# Patient Record
Sex: Female | Born: 1990 | Race: Black or African American | Hispanic: No | Marital: Single | Smoking: Never smoker
Health system: Southern US, Community
[De-identification: ages and names within clinical notes are randomized; demographics above are authoritative.]

## PROBLEM LIST (undated history)

## (undated) DIAGNOSIS — Z789 Other specified health status: Secondary | ICD-10-CM

## (undated) HISTORY — PX: HERNIA REPAIR: SHX51

---

## 2017-02-09 ENCOUNTER — Ambulatory Visit (INDEPENDENT_AMBULATORY_CARE_PROVIDER_SITE_OTHER): Payer: BLUE CROSS/BLUE SHIELD | Admitting: Certified Nurse Midwife

## 2017-02-09 ENCOUNTER — Encounter: Payer: Self-pay | Admitting: Certified Nurse Midwife

## 2017-02-09 VITALS — BP 112/68 | HR 80 | Ht 66.5 in | Wt 181.9 lb

## 2017-02-09 DIAGNOSIS — O26899 Other specified pregnancy related conditions, unspecified trimester: Secondary | ICD-10-CM | POA: Diagnosis not present

## 2017-02-09 DIAGNOSIS — Z3201 Encounter for pregnancy test, result positive: Secondary | ICD-10-CM

## 2017-02-09 DIAGNOSIS — N926 Irregular menstruation, unspecified: Secondary | ICD-10-CM

## 2017-02-09 DIAGNOSIS — R109 Unspecified abdominal pain: Secondary | ICD-10-CM | POA: Diagnosis not present

## 2017-02-09 DIAGNOSIS — N912 Amenorrhea, unspecified: Secondary | ICD-10-CM

## 2017-02-09 LAB — POCT URINE PREGNANCY: PREG TEST UR: POSITIVE — AB

## 2017-02-09 NOTE — Patient Instructions (Signed)

## 2017-02-09 NOTE — Progress Notes (Signed)
GYN ENCOUNTER NOTE  Subjective:       Julia SessionsKenyata Sult is a 26 y.o. G1P0 female is here for gynecologic evaluation of the following issues: missed menses and positive pregnancy test.   [facility] [place]. This pregnancy was planned.   Toyna complains of breast tenderness, intermittent abdominal cramping, and diarrhea.   Denies difficulty breathing or respiratory distress, chest pain, nausea and vomiting, vaginal bleeding, and leg pain or swelling.   Gynecologic History  Patient's last menstrual period was 12/27/2016.  EDD: 10/03/2017  Gestational age: 53 weeks 2 days  Obstetric History OB History  Gravida Para Term Preterm AB Living  1            SAB TAB Ectopic Multiple Live Births               # Outcome Date GA Lbr Len/2nd Weight Sex Delivery Anes PTL Lv  1 Current               History reviewed. No pertinent past medical history.  Past Surgical History:  Procedure Laterality Date  . HERNIA REPAIR      No current outpatient prescriptions on file prior to visit.   No current facility-administered medications on file prior to visit.     No Known Allergies  Social History   Social History  . Marital status: Single    Spouse name: N/A  . Number of children: N/A  . Years of education: N/A   Occupational History  . Not on file.   Social History Main Topics  . Smoking status: Never Smoker  . Smokeless tobacco: Never Used  . Alcohol use No  . Drug use: No  . Sexual activity: Yes    Birth control/ protection: None   Other Topics Concern  . Not on file   Social History Narrative  . No narrative on file    History reviewed. No pertinent family history.  The following portions of the patient's history were reviewed and updated as appropriate: allergies, current medications, past family history, past medical history, past social history, past surgical history and problem list.  Review of  Systems  Review of Systems - Negative except for as noted above History obtained from the patient   Objective:   BP 112/68 (BP Location: Left Arm, Patient Position: Sitting, Cuff Size: Normal)   Pulse 80   Ht 5' 6.5" (1.689 m)   Wt 181 lb 14.4 oz (82.5 kg)   LMP 12/27/2016   BMI 28.92 kg/m   Alert and oriented x 4  Positive UPT  Assessment:   1. Amenorrhea  - POCT urine pregnancy  2. Missed menses   3. Positive pregnancy test   4. Cramping affecting pregnancy, antepartum  - US OB Transvaginal; Future  Plan:   Encouraged PNV with folic acid and DHA Reviewed pregnancy safe foods and medications RTC x 2 weeks for dating and viability US RTC x 4 weeks for nurse intake Reviewed red flag symptoms and when to call   Gunnar BullaJenkins Michelle Winter Trefz, CNM

## 2017-02-20 ENCOUNTER — Ambulatory Visit (INDEPENDENT_AMBULATORY_CARE_PROVIDER_SITE_OTHER): Payer: BLUE CROSS/BLUE SHIELD

## 2017-02-20 DIAGNOSIS — O26899 Other specified pregnancy related conditions, unspecified trimester: Secondary | ICD-10-CM | POA: Diagnosis not present

## 2017-02-20 DIAGNOSIS — R109 Unspecified abdominal pain: Secondary | ICD-10-CM

## 2017-03-06 ENCOUNTER — Encounter: Payer: Self-pay | Admitting: Certified Nurse Midwife

## 2017-03-06 ENCOUNTER — Ambulatory Visit (INDEPENDENT_AMBULATORY_CARE_PROVIDER_SITE_OTHER): Payer: BLUE CROSS/BLUE SHIELD | Admitting: Certified Nurse Midwife

## 2017-03-06 VITALS — BP 105/61 | HR 70 | Wt 182.6 lb

## 2017-03-06 DIAGNOSIS — O26891 Other specified pregnancy related conditions, first trimester: Secondary | ICD-10-CM

## 2017-03-06 DIAGNOSIS — N898 Other specified noninflammatory disorders of vagina: Secondary | ICD-10-CM

## 2017-03-06 DIAGNOSIS — Z3491 Encounter for supervision of normal pregnancy, unspecified, first trimester: Secondary | ICD-10-CM

## 2017-03-06 LAB — POCT URINALYSIS DIPSTICK
Bilirubin, UA: NEGATIVE
Blood, UA: NEGATIVE
Glucose, UA: NEGATIVE
Ketones, UA: NEGATIVE
Leukocytes, UA: NEGATIVE
Nitrite, UA: NEGATIVE
Protein, UA: NEGATIVE
Spec Grav, UA: 1.015
Urobilinogen, UA: NEGATIVE
pH, UA: 5

## 2017-03-06 MED ORDER — PROVIDA DHA 16-16-1.25-110 MG PO CAPS: 1.0000 | ORAL_CAPSULE | Freq: Every day | ORAL | 6 refills | Status: DC

## 2017-03-06 NOTE — Patient Instructions (Signed)
First Trimester of Pregnancy The first trimester of pregnancy is from week 1 until the end of week 13 (months 1 through 3). During this time, your baby will begin to develop inside you. At 6-8 weeks, the eyes and face are formed, and the heartbeat can be seen on ultrasound. At the end of 12 weeks, all the baby's organs are formed. Prenatal care is all the medical care you receive before the birth of your baby. Make sure you get good prenatal care and follow all of your doctor's instructions. Follow these instructions at home: Medicines  Take over-the-counter and prescription medicines only as told by your doctor. Some medicines are safe and some medicines are not safe during pregnancy.  Take a prenatal vitamin that contains at least 600 micrograms (mcg) of folic acid.  If you have trouble pooping (constipation), take medicine that will make your stool soft (stool softener) if your doctor approves. Eating and drinking  Eat regular, healthy meals.  Your doctor will tell you the amount of weight gain that is right for you.  Avoid raw meat and uncooked cheese.  If you feel sick to your stomach (nauseous) or throw up (vomit): ? Eat 4 or 5 small meals a day instead of 3 large meals. ? Try eating a few soda crackers. ? Drink liquids between meals instead of during meals.  To prevent constipation: ? Eat foods that are high in fiber, like fresh fruits and vegetables, whole grains, and beans. ? Drink enough fluids to keep your pee (urine) clear or pale yellow. Activity  Exercise only as told by your doctor. Stop exercising if you have cramps or pain in your lower belly (abdomen) or low back.  Do not exercise if it is too hot, too humid, or if you are in a place of great height (high altitude).  Try to avoid standing for long periods of time. Move your legs often if you must stand in one place for a long time.  Avoid heavy lifting.  Wear low-heeled shoes. Sit and stand up straight.  You  can have sex unless your doctor tells you not to. Relieving pain and discomfort  Wear a good support bra if your breasts are sore.  Take warm water baths (sitz baths) to soothe pain or discomfort caused by hemorrhoids. Use hemorrhoid cream if your doctor says it is okay.  Rest with your legs raised if you have leg cramps or low back pain.  If you have puffy, bulging veins (varicose veins) in your legs: ? Wear support hose or compression stockings as told by your doctor. ? Raise (elevate) your feet for 15 minutes, 3-4 times a day. ? Limit salt in your food. Prenatal care  Schedule your prenatal visits by the twelfth week of pregnancy.  Write down your questions. Take them to your prenatal visits.  Keep all your prenatal visits as told by your doctor. This is important. Safety  Wear your seat belt at all times when driving.  Make a list of emergency phone numbers. The list should include numbers for family, friends, the hospital, and police and fire departments. General instructions  Ask your doctor for a referral to a local prenatal class. Begin classes no later than at the start of month 6 of your pregnancy.  Ask for help if you need counseling or if you need help with nutrition. Your doctor can give you advice or tell you where to go for help.  Do not use hot tubs, steam rooms, or   saunas.  Do not douche or use tampons or scented sanitary pads.  Do not Torry your legs for long periods of time.  Avoid all herbs and alcohol. Avoid drugs that are not approved by your doctor.  Do not use any tobacco products, including cigarettes, chewing tobacco, and electronic cigarettes. If you need help quitting, ask your doctor. You may get counseling or other support to help you quit.  Avoid cat litter boxes and soil used by cats. These carry germs that can cause birth defects in the baby and can cause a loss of your baby (miscarriage) or stillbirth.  Visit your dentist. At home, brush  your teeth with a soft toothbrush. Be gentle when you floss. Contact a doctor if:  You are dizzy.  You have mild cramps or pressure in your lower belly.  You have a nagging pain in your belly area.  You continue to feel sick to your stomach, you throw up, or you have watery poop (diarrhea).  You have a bad smelling fluid coming from your vagina.  You have pain when you pee (urinate).  You have increased puffiness (swelling) in your face, hands, legs, or ankles. Get help right away if:  You have a fever.  You are leaking fluid from your vagina.  You have spotting or bleeding from your vagina.  You have very bad belly cramping or pain.  You gain or lose weight rapidly.  You throw up blood. It may look like coffee grounds.  You are around people who have German measles, fifth disease, or chickenpox.  You have a very bad headache.  You have shortness of breath.  You have any kind of trauma, such as from a fall or a car accident. Summary  The first trimester of pregnancy is from week 1 until the end of week 13 (months 1 through 3).  To take care of yourself and your unborn baby, you will need to eat healthy meals, take medicines only if your doctor tells you to do so, and do activities that are safe for you and your baby.  Keep all follow-up visits as told by your doctor. This is important as your doctor will have to ensure that your baby is healthy and growing well. This information is not intended to replace advice given to you by your health care provider. Make sure you discuss any questions you have with your health care provider. Document Released: 05/19/2008 Document Revised: 12/09/2016 Document Reviewed: 12/09/2016 Elsevier Interactive Patient Education  2017 Elsevier Inc.  

## 2017-03-06 NOTE — Progress Notes (Signed)
.  Grayce SessionsKenyata Anglemyer presents for NOB nurse interview visit. Pregnancy confirmation done 02/09/2017 by JML.  G- 1.  P- .0 Pregnancy education material explained and given.03/06/17 __0_ cats in the home. NOB labs ordered. (TSH/HbgA1c due to Increased BMI), (sickle cell). HIV labs and Drug screen were explained optional and she did not decline. Drug screen ordered. PNV encouraged. Genetic screening options discussed. Genetic testing: unsure.  Pt may discuss with provider at next visit. Pt. To follow up with provider in _4_ weeks for NOB physical.  All questions answered. C/o slight vag odor- has hx of Trich; NuSwab self collected.

## 2017-03-06 NOTE — Progress Notes (Signed)
I have reviewed the record and concur with patient management and plan.    Zeek Rostron Michelle Opha Mcghee, CNM [facility] 

## 2017-03-08 LAB — SICKLE CELL SCREEN: SICKLE CELL SCREEN: NEGATIVE

## 2017-03-08 LAB — CBC WITH DIFFERENTIAL
Basophils Absolute: 0 10*3/uL (ref 0.0–0.2)
Basos: 0 %
EOS (ABSOLUTE): 0 10*3/uL (ref 0.0–0.4)
EOS: 1 %
HEMATOCRIT: 38.9 % (ref 34.0–46.6)
Hemoglobin: 12.8 g/dL (ref 11.1–15.9)
Immature Grans (Abs): 0 10*3/uL (ref 0.0–0.1)
Immature Granulocytes: 0 %
Lymphocytes Absolute: 1.3 10*3/uL (ref 0.7–3.1)
Lymphs: 27 %
MCH: 28.3 pg (ref 26.6–33.0)
MCHC: 32.9 g/dL (ref 31.5–35.7)
MCV: 86 fL (ref 79–97)
MONOS ABS: 0.3 10*3/uL (ref 0.1–0.9)
Monocytes: 6 %
Neutrophils Absolute: 3.2 10*3/uL (ref 1.4–7.0)
Neutrophils: 66 %
RBC: 4.52 x10E6/uL (ref 3.77–5.28)
RDW: 14.1 % (ref 12.3–15.4)
WBC: 4.9 10*3/uL (ref 3.4–10.8)

## 2017-03-08 LAB — HEPATITIS B SURFACE ANTIGEN: HEP B S AG: NEGATIVE

## 2017-03-08 LAB — GC/CHLAMYDIA PROBE AMP
Chlamydia trachomatis, NAA: NEGATIVE
Neisseria gonorrhoeae by PCR: NEGATIVE

## 2017-03-08 LAB — RPR: RPR: NONREACTIVE

## 2017-03-08 LAB — ANTIBODY SCREEN: Antibody Screen: NEGATIVE

## 2017-03-08 LAB — HIV ANTIBODY (ROUTINE TESTING W REFLEX): HIV SCREEN 4TH GENERATION: NONREACTIVE

## 2017-03-08 LAB — URINE CULTURE

## 2017-03-08 LAB — VARICELLA ZOSTER ANTIBODY, IGG: Varicella zoster IgG: 1258 index (ref >165)

## 2017-03-08 LAB — ABO AND RH: Rh Factor: POSITIVE

## 2017-03-08 LAB — RUBELLA SCREEN: Rubella Antibodies, IGG: 4.2 index (ref >0.99)

## 2017-03-09 ENCOUNTER — Encounter: Payer: Self-pay | Admitting: Certified Nurse Midwife

## 2017-03-09 NOTE — Addendum Note (Signed)
Addended by: Marchelle FolksMILLER, Emilyann Banka G on: 03/09/2017 03:59 PM   Modules accepted: Orders

## 2017-03-12 ENCOUNTER — Other Ambulatory Visit: Payer: Self-pay | Admitting: Certified Nurse Midwife

## 2017-03-12 ENCOUNTER — Other Ambulatory Visit: Payer: Self-pay

## 2017-03-12 LAB — NUSWAB BV AND CANDIDA, NAA
Atopobium vaginae: HIGH Score — AB
BVAB 2: HIGH {score} — AB
Candida albicans, NAA: NEGATIVE
Candida glabrata, NAA: NEGATIVE
Megasphaera 1: HIGH Score — AB

## 2017-03-12 MED ORDER — METRONIDAZOLE 500 MG PO TABS: 500.0000 mg | ORAL_TABLET | Freq: Two times a day (BID) | ORAL | 0 refills | Status: AC

## 2017-03-12 NOTE — Telephone Encounter (Signed)
Pt has 6 refills, paper faxed.

## 2017-03-13 LAB — URINALYSIS, ROUTINE W REFLEX MICROSCOPIC
Bilirubin, UA: NEGATIVE
GLUCOSE, UA: NEGATIVE
Ketones, UA: NEGATIVE
Leukocytes, UA: NEGATIVE
Nitrite, UA: NEGATIVE
Protein, UA: NEGATIVE
RBC, UA: NEGATIVE
SPEC GRAV UA: 1.02 (ref 1.005–1.030)
Urobilinogen, Ur: 0.2 mg/dL (ref 0.2–1.0)
pH, UA: 8 — ABNORMAL HIGH (ref 5.0–7.5)

## 2017-03-13 LAB — CANNABINOID (GC/MS), URINE
CANNABINOID UR: POSITIVE — AB
CARBOXY THC UR: 61 ng/mL

## 2017-03-13 LAB — MONITOR DRUG PROFILE 14(MW)
AMPHETAMINE SCREEN URINE: NEGATIVE ng/mL
BARBITURATE SCREEN URINE: NEGATIVE ng/mL
BENZODIAZEPINE SCREEN, URINE: NEGATIVE ng/mL
Buprenorphine, Urine: NEGATIVE ng/mL
Cocaine (Metab) Scrn, Ur: NEGATIVE ng/mL
Creatinine(Crt), U: 111 mg/dL (ref 20.0–300.0)
FENTANYL, URINE: NEGATIVE pg/mL
Meperidine Screen, Urine: NEGATIVE ng/mL
Methadone Screen, Urine: NEGATIVE ng/mL
OXYCODONE+OXYMORPHONE UR QL SCN: NEGATIVE ng/mL
Opiate Scrn, Ur: NEGATIVE ng/mL
PH UR, DRUG SCRN: 8.8 (ref 4.5–8.9)
Phencyclidine Qn, Ur: NEGATIVE ng/mL
Propoxyphene Scrn, Ur: NEGATIVE ng/mL
SPECIFIC GRAVITY: 1.022
Tramadol Screen, Urine: NEGATIVE ng/mL

## 2017-03-13 LAB — NICOTINE SCREEN, URINE: COTININE UR QL SCN: NEGATIVE ng/mL

## 2017-03-31 ENCOUNTER — Ambulatory Visit (INDEPENDENT_AMBULATORY_CARE_PROVIDER_SITE_OTHER): Payer: BLUE CROSS/BLUE SHIELD | Admitting: Certified Nurse Midwife

## 2017-03-31 VITALS — BP 97/52 | HR 85 | Wt 184.9 lb

## 2017-03-31 DIAGNOSIS — F1291 Cannabis use, unspecified, in remission: Secondary | ICD-10-CM

## 2017-03-31 DIAGNOSIS — Z87898 Personal history of other specified conditions: Secondary | ICD-10-CM

## 2017-03-31 DIAGNOSIS — Z3492 Encounter for supervision of normal pregnancy, unspecified, second trimester: Secondary | ICD-10-CM

## 2017-03-31 LAB — POCT URINALYSIS DIPSTICK
Bilirubin, UA: NEGATIVE
Glucose, UA: NEGATIVE
KETONES UA: NEGATIVE
Leukocytes, UA: NEGATIVE
Nitrite, UA: NEGATIVE
RBC UA: NEGATIVE
SPEC GRAV UA: 1.01 (ref 1.010–1.025)
UROBILINOGEN UA: 0.2 U/dL
pH, UA: 7 (ref 5.0–8.0)

## 2017-03-31 NOTE — Progress Notes (Signed)
NEW OB HISTORY AND PHYSICAL  SUBJECTIVE:       Julia Rollins is a 26 y.o. G1P0 female, Patient's last menstrual period was 12/27/2016., Estimated Date of Delivery: 10/03/17, [redacted]w[redacted]d, presents today for establishment of Prenatal Care.  She reports nausea and breast tenderness.   Denies difficulty breathing or respiratory distress, chest pain, abdominal pain, vaginal bleeding, and leg pain or swelling.   Declines genetic screening.    Gynecologic History  Patient's last menstrual period was 12/27/2016.   Last Pap: 01/2017. Results were: normal  Obstetric History OB History  Gravida Para Term Preterm AB Living  1            SAB TAB Ectopic Multiple Live Births               # Outcome Date GA Lbr Len/2nd Weight Sex Delivery Anes PTL Lv  1 Current               No past medical history on file.  Past Surgical History:  Procedure Laterality Date  . HERNIA REPAIR      Current Outpatient Prescriptions on File Prior to Visit  Medication Sig Dispense Refill  . Prenatal Vit-Fe Fumarate-FA (PRENATAL MULTIVITAMIN) TABS tablet Take 1 tablet by mouth daily at 12 noon.    . Prenat-FeFum-FePo-FA-DHA w/o A (PROVIDA DHA) 16-16-1.25-110 MG CAPS Take 1 tablet by mouth daily. (Patient not taking: Reported on 03/31/2017) 30 capsule 6   No current facility-administered medications on file prior to visit.     No Known Allergies  Social History   Social History  . Marital status: Single    Spouse name: N/A  . Number of children: N/A  . Years of education: N/A   Occupational History  . Not on file.   Social History Main Topics  . Smoking status: Never Smoker  . Smokeless tobacco: Never Used  . Alcohol use No  . Drug use: No  . Sexual activity: Yes    Birth control/ protection: None   Other Topics Concern  . Not on file   Social History Narrative  . No narrative on file    No family history on file.  The following portions of the patient's history were reviewed and updated  as appropriate: allergies, current medications, past OB history, past medical history, past surgical history, past family history, past social history, and problem list.    OBJECTIVE: Initial Physical Exam (New OB)  GENERAL APPEARANCE: alert, well appearing, in no apparent distress  HEAD: normocephalic, atraumatic  MOUTH: mucous membranes moist, pharynx normal without lesions and dental hygiene good  THYROID: no thyromegaly or masses present  BREASTS: not examined  LUNGS: clear to auscultation, no wheezes, rales or rhonchi, symmetric air entry  HEART: regular rate and rhythm, no murmurs  ABDOMEN: soft, nontender, nondistended, no abnormal masses, no epigastric pain, fundus not palpable and FHT present  EXTREMITIES: no redness or tenderness in the calves or thighs, no edema  SKIN: normal coloration and turgor, no rashes  LYMPH NODES: no adenopathy palpable  NEUROLOGIC: alert, oriented, normal speech, no focal findings or movement disorder noted  PELVIC EXAM not examined  ASSESSMENT: Normal pregnancy  PLAN: Prenatal care Reviewed results of New OB labs work, pt no longer smoking marijuana Discussed safe pregnancy foods and medications.  Reviewed practice policies, red flag symptoms, and when to call.  RTC x 4 weeks for ROB.  See orders   Gunnar Bulla, CNM

## 2017-03-31 NOTE — Progress Notes (Signed)
NOB physical- pt is doing well, denies any complaints 

## 2017-03-31 NOTE — Patient Instructions (Signed)
Second Trimester of Pregnancy The second trimester is from week 13 through week 28, month 4 through 6. This is often the time in pregnancy that you feel your best. Often times, morning sickness has lessened or quit. You may have more energy, and you may get hungry more often. Your unborn baby (fetus) is growing rapidly. At the end of the sixth month, he or she is about 9 inches long and weighs about 1 pounds. You will likely feel the baby move (quickening) between 18 and 20 weeks of pregnancy. Follow these instructions at home:  Avoid all smoking, herbs, and alcohol. Avoid drugs not approved by your doctor.  Do not use any tobacco products, including cigarettes, chewing tobacco, and electronic cigarettes. If you need help quitting, ask your doctor. You may get counseling or other support to help you quit.  Only take medicine as told by your doctor. Some medicines are safe and some are not during pregnancy.  Exercise only as told by your doctor. Stop exercising if you start having cramps.  Eat regular, healthy meals.  Wear a good support bra if your breasts are tender.  Do not use hot tubs, steam rooms, or saunas.  Wear your seat belt when driving.  Avoid raw meat, uncooked cheese, and liter boxes and soil used by cats.  Take your prenatal vitamins.  Take 1500-2000 milligrams of calcium daily starting at the 20th week of pregnancy until you deliver your baby.  Try taking medicine that helps you poop (stool softener) as needed, and if your doctor approves. Eat more fiber by eating fresh fruit, vegetables, and whole grains. Drink enough fluids to keep your pee (urine) clear or pale yellow.  Take warm water baths (sitz baths) to soothe pain or discomfort caused by hemorrhoids. Use hemorrhoid cream if your doctor approves.  If you have puffy, bulging veins (varicose veins), wear support hose. Raise (elevate) your feet for 15 minutes, 3-4 times a day. Limit salt in your diet.  Avoid heavy  lifting, wear low heals, and sit up straight.  Rest with your legs raised if you have leg cramps or low back pain.  Visit your dentist if you have not gone during your pregnancy. Use a soft toothbrush to brush your teeth. Be gentle when you floss.  You can have sex (intercourse) unless your doctor tells you not to.  Go to your doctor visits. Get help if:  You feel dizzy.  You have mild cramps or pressure in your lower belly (abdomen).  You have a nagging pain in your belly area.  You continue to feel sick to your stomach (nauseous), throw up (vomit), or have watery poop (diarrhea).  You have bad smelling fluid coming from your vagina.  You have pain with peeing (urination). Get help right away if:  You have a fever.  You are leaking fluid from your vagina.  You have spotting or bleeding from your vagina.  You have severe belly cramping or pain.  You lose or gain weight rapidly.  You have trouble catching your breath and have chest pain.  You notice sudden or extreme puffiness (swelling) of your face, hands, ankles, feet, or legs.  You have not felt the baby move in over an hour.  You have severe headaches that do not go away with medicine.  You have vision changes. This information is not intended to replace advice given to you by your health care provider. Make sure you discuss any questions you have with your health care   provider. Document Released: 02/25/2010 Document Revised: 05/08/2016 Document Reviewed: 02/01/2013 Elsevier Interactive Patient Education  2017 Elsevier Inc.  

## 2017-04-06 ENCOUNTER — Encounter: Payer: Self-pay | Admitting: Certified Nurse Midwife

## 2017-04-08 DIAGNOSIS — F1290 Cannabis use, unspecified, uncomplicated: Secondary | ICD-10-CM | POA: Insufficient documentation

## 2017-04-08 DIAGNOSIS — Z87898 Personal history of other specified conditions: Secondary | ICD-10-CM | POA: Insufficient documentation

## 2017-04-08 DIAGNOSIS — F1291 Cannabis use, unspecified, in remission: Secondary | ICD-10-CM | POA: Insufficient documentation

## 2017-04-12 ENCOUNTER — Encounter: Payer: Self-pay | Admitting: Certified Nurse Midwife

## 2017-04-29 ENCOUNTER — Ambulatory Visit (INDEPENDENT_AMBULATORY_CARE_PROVIDER_SITE_OTHER): Payer: BLUE CROSS/BLUE SHIELD | Admitting: Certified Nurse Midwife

## 2017-04-29 VITALS — BP 96/63 | HR 77 | Wt 188.3 lb

## 2017-04-29 DIAGNOSIS — Z3492 Encounter for supervision of normal pregnancy, unspecified, second trimester: Secondary | ICD-10-CM

## 2017-04-29 LAB — POCT URINALYSIS DIPSTICK
BILIRUBIN UA: NEGATIVE
GLUCOSE UA: NEGATIVE
Ketones, UA: NEGATIVE
Leukocytes, UA: NEGATIVE
NITRITE UA: NEGATIVE
Protein, UA: NEGATIVE
RBC UA: NEGATIVE
SPEC GRAV UA: 1.01 (ref 1.010–1.025)
UROBILINOGEN UA: 0.2 U/dL
pH, UA: 7.5 (ref 5.0–8.0)

## 2017-04-29 NOTE — Progress Notes (Signed)
ROB doing well. No complaints. States that she is not able to take the prenatals , it made her sick. Samples given to her. She states that she is feeling some fluttering. Denies LOF, Vag bleeding and contractions. She will return in 3 wks for anatomy scan and ROB.  Doreene BurkeAnnie Burnie Hank, CNM

## 2017-04-29 NOTE — Patient Instructions (Signed)

## 2017-05-13 ENCOUNTER — Other Ambulatory Visit: Payer: Self-pay | Admitting: Certified Nurse Midwife

## 2017-05-13 DIAGNOSIS — Z369 Encounter for antenatal screening, unspecified: Secondary | ICD-10-CM

## 2017-05-20 ENCOUNTER — Ambulatory Visit (INDEPENDENT_AMBULATORY_CARE_PROVIDER_SITE_OTHER): Payer: BLUE CROSS/BLUE SHIELD

## 2017-05-20 ENCOUNTER — Encounter: Payer: Self-pay | Admitting: Certified Nurse Midwife

## 2017-05-20 ENCOUNTER — Ambulatory Visit (INDEPENDENT_AMBULATORY_CARE_PROVIDER_SITE_OTHER): Payer: BLUE CROSS/BLUE SHIELD | Admitting: Certified Nurse Midwife

## 2017-05-20 VITALS — BP 106/62 | HR 66 | Wt 189.9 lb

## 2017-05-20 DIAGNOSIS — Z369 Encounter for antenatal screening, unspecified: Secondary | ICD-10-CM

## 2017-05-20 DIAGNOSIS — Z3482 Encounter for supervision of other normal pregnancy, second trimester: Secondary | ICD-10-CM | POA: Diagnosis not present

## 2017-05-20 DIAGNOSIS — Z3492 Encounter for supervision of normal pregnancy, unspecified, second trimester: Secondary | ICD-10-CM

## 2017-05-20 LAB — POCT URINALYSIS DIPSTICK
BILIRUBIN UA: NEGATIVE
Glucose, UA: NEGATIVE
KETONES UA: NEGATIVE
Nitrite, UA: NEGATIVE
PH UA: 6.5 (ref 5.0–8.0)
Protein, UA: NEGATIVE
RBC UA: NEGATIVE
SPEC GRAV UA: 1.01 (ref 1.010–1.025)
Urobilinogen, UA: 0.2 E.U./dL

## 2017-06-02 ENCOUNTER — Encounter: Payer: Self-pay | Admitting: Certified Nurse Midwife

## 2017-06-02 ENCOUNTER — Ambulatory Visit (INDEPENDENT_AMBULATORY_CARE_PROVIDER_SITE_OTHER): Payer: BLUE CROSS/BLUE SHIELD | Admitting: Certified Nurse Midwife

## 2017-06-02 VITALS — BP 107/63 | HR 96 | Wt 193.2 lb

## 2017-06-02 DIAGNOSIS — Z131 Encounter for screening for diabetes mellitus: Secondary | ICD-10-CM

## 2017-06-02 DIAGNOSIS — R82998 Other abnormal findings in urine: Secondary | ICD-10-CM

## 2017-06-02 DIAGNOSIS — Z130 Encounter for screening for diseases of the blood and blood-forming organs and certain disorders involving the immune mechanism: Secondary | ICD-10-CM

## 2017-06-02 DIAGNOSIS — Z3492 Encounter for supervision of normal pregnancy, unspecified, second trimester: Secondary | ICD-10-CM

## 2017-06-02 DIAGNOSIS — R8299 Other abnormal findings in urine: Secondary | ICD-10-CM

## 2017-06-02 LAB — POCT URINALYSIS DIPSTICK
BILIRUBIN UA: NEGATIVE
Blood, UA: NEGATIVE
Glucose, UA: NEGATIVE
Nitrite, UA: NEGATIVE
PH UA: 6 (ref 5.0–8.0)
PROTEIN UA: NEGATIVE
Spec Grav, UA: 1.02 (ref 1.010–1.025)
UROBILINOGEN UA: 0.2 U/dL

## 2017-06-02 NOTE — Patient Instructions (Signed)

## 2017-06-02 NOTE — Progress Notes (Signed)
ROB, doing well. No complaints. PT to schedule u/s to complete anatomy scan as soon as possible. Had urinary urgency but drank a lot of water and has resolved. Urine dip today show mod leukocytes and small ketones. Sent for culture.  Follow up ROB in 4 wks.   Doreene BurkeAnnie Clayson Riling, CNM

## 2017-06-05 ENCOUNTER — Other Ambulatory Visit: Payer: Self-pay | Admitting: Certified Nurse Midwife

## 2017-06-05 DIAGNOSIS — Z0489 Encounter for examination and observation for other specified reasons: Secondary | ICD-10-CM

## 2017-06-05 DIAGNOSIS — IMO0002 Reserved for concepts with insufficient information to code with codable children: Secondary | ICD-10-CM

## 2017-06-07 LAB — URINE CULTURE, OB REFLEX

## 2017-06-07 LAB — CULTURE, OB URINE

## 2017-06-08 ENCOUNTER — Encounter: Payer: Self-pay | Admitting: Certified Nurse Midwife

## 2017-06-15 ENCOUNTER — Ambulatory Visit (INDEPENDENT_AMBULATORY_CARE_PROVIDER_SITE_OTHER): Payer: BLUE CROSS/BLUE SHIELD

## 2017-06-15 DIAGNOSIS — Z048 Encounter for examination and observation for other specified reasons: Secondary | ICD-10-CM

## 2017-06-15 DIAGNOSIS — Z0489 Encounter for examination and observation for other specified reasons: Secondary | ICD-10-CM

## 2017-06-15 DIAGNOSIS — IMO0002 Reserved for concepts with insufficient information to code with codable children: Secondary | ICD-10-CM

## 2017-06-16 ENCOUNTER — Other Ambulatory Visit: Payer: BLUE CROSS/BLUE SHIELD

## 2017-06-30 ENCOUNTER — Encounter: Payer: BLUE CROSS/BLUE SHIELD | Admitting: Certified Nurse Midwife

## 2017-07-22 ENCOUNTER — Ambulatory Visit (INDEPENDENT_AMBULATORY_CARE_PROVIDER_SITE_OTHER): Payer: BLUE CROSS/BLUE SHIELD | Admitting: Certified Nurse Midwife

## 2017-07-22 VITALS — BP 104/62 | HR 90 | Wt 201.9 lb

## 2017-07-22 DIAGNOSIS — Z3403 Encounter for supervision of normal first pregnancy, third trimester: Secondary | ICD-10-CM

## 2017-07-22 LAB — POCT URINALYSIS DIPSTICK
BILIRUBIN UA: NEGATIVE
Blood, UA: NEGATIVE
GLUCOSE UA: NEGATIVE
KETONES UA: NEGATIVE
LEUKOCYTES UA: NEGATIVE
Nitrite, UA: NEGATIVE
Protein, UA: NEGATIVE
SPEC GRAV UA: 1.015 (ref 1.010–1.025)
Urobilinogen, UA: 0.2 E.U./dL
pH, UA: 6 (ref 5.0–8.0)

## 2017-07-22 NOTE — Progress Notes (Signed)
ROB, doing well. No complaints or concerns. Endorses good fetal movement. Follow up in 2 wks.   Doreene BurkeAnnie Piper Albro, CNM

## 2017-07-22 NOTE — Patient Instructions (Signed)

## 2017-08-04 ENCOUNTER — Encounter: Payer: BLUE CROSS/BLUE SHIELD | Admitting: Certified Nurse Midwife

## 2017-08-06 ENCOUNTER — Encounter: Payer: BLUE CROSS/BLUE SHIELD | Admitting: Certified Nurse Midwife

## 2017-08-12 ENCOUNTER — Encounter: Payer: Self-pay | Admitting: Certified Nurse Midwife

## 2017-08-12 ENCOUNTER — Ambulatory Visit (INDEPENDENT_AMBULATORY_CARE_PROVIDER_SITE_OTHER): Payer: Medicaid Other | Admitting: Certified Nurse Midwife

## 2017-08-12 VITALS — BP 120/67 | HR 88 | Wt 208.4 lb

## 2017-08-12 DIAGNOSIS — L298 Other pruritus: Secondary | ICD-10-CM

## 2017-08-12 DIAGNOSIS — N898 Other specified noninflammatory disorders of vagina: Secondary | ICD-10-CM

## 2017-08-12 DIAGNOSIS — Z3403 Encounter for supervision of normal first pregnancy, third trimester: Secondary | ICD-10-CM

## 2017-08-12 LAB — POCT URINALYSIS DIPSTICK
BILIRUBIN UA: NEGATIVE
Blood, UA: NEGATIVE
GLUCOSE UA: NEGATIVE
KETONES UA: NEGATIVE
LEUKOCYTES UA: NEGATIVE
Nitrite, UA: NEGATIVE
PROTEIN UA: NEGATIVE
SPEC GRAV UA: 1.01 (ref 1.010–1.025)
Urobilinogen, UA: 0.2 E.U./dL
pH, UA: 6 (ref 5.0–8.0)

## 2017-08-12 NOTE — Progress Notes (Signed)
Pt presents today for ROB . She complains of increased discharge and itching x 1 wk. She denies any exposure to STD. NUswab today f. She denies contractions and endorses good fetal movement. Encouraged pt to use Monistat .  She states that she never had 1 hr GTT. Last appointment was canceled due to CNM rescheduling ( CNM in delivery). Will follow up with results. Pt encouraged to have 1 hr GTT today, she states that she can not. She will return in the next day or 2 for lab draw. Return as scheduled for next ROB appointment.   Doreene BurkeAnnie Arretta Toenjes, CNM

## 2017-08-12 NOTE — Patient Instructions (Addendum)
Vaginal Yeast infection, Adult Vaginal yeast infection is a condition that causes soreness, swelling, and redness (inflammation) of the vagina. It also causes vaginal discharge. This is a common condition. Some women get this infection frequently. What are the causes? This condition is caused by a change in the normal balance of the yeast (candida) and bacteria that live in the vagina. This change causes an overgrowth of yeast, which causes the inflammation. What increases the risk? This condition is more likely to develop in:  Women who take antibiotic medicines.  Women who have diabetes.  Women who take birth control pills.  Women who are pregnant.  Women who douche often.  Women who have a weak defense (immune) system.  Women who have been taking steroid medicines for a long time.  Women who frequently wear tight clothing.  What are the signs or symptoms? Symptoms of this condition include:  White, thick vaginal discharge.  Swelling, itching, redness, and irritation of the vagina. The lips of the vagina (vulva) may be affected as well.  Pain or a burning feeling while urinating.  Pain during sex.  How is this diagnosed? This condition is diagnosed with a medical history and physical exam. This will include a pelvic exam. Your health care provider will examine a sample of your vaginal discharge under a microscope. Your health care provider may send this sample for testing to confirm the diagnosis. How is this treated? This condition is treated with medicine. Medicines may be over-the-counter or prescription. You may be told to use one or more of the following:  Medicine that is taken orally.  Medicine that is applied as a cream.  Medicine that is inserted directly into the vagina (suppository).  Follow these instructions at home:  Take or apply over-the-counter and prescription medicines only as told by your health care provider.  Do not have sex until your health  care provider has approved. Tell your sex partner that you have a yeast infection. That person should go to his or her health care provider if he or she develops symptoms.  Do not wear tight clothes, such as pantyhose or tight pants.  Avoid using tampons until your health care provider approves.  Eat more yogurt. This may help to keep your yeast infection from returning.  Try taking a sitz bath to help with discomfort. This is a warm water bath that is taken while you are sitting down. The water should only come up to your hips and should cover your buttocks. Do this 3-4 times per day or as told by your health care provider.  Do not douche.  Wear breathable, cotton underwear.  If you have diabetes, keep your blood sugar levels under control. Contact a health care provider if:  You have a fever.  Your symptoms go away and then return.  Your symptoms do not get better with treatment.  Your symptoms get worse.  You have new symptoms.  You develop blisters in or around your vagina.  You have blood coming from your vagina and it is not your menstrual period.  You develop pain in your abdomen. This information is not intended to replace advice given to you by your health care provider. Make sure you discuss any questions you have with your health care provider. Document Released: 09/10/2005 Document Revised: 05/14/2016 Document Reviewed: 06/04/2015 Elsevier Interactive Patient Education  2018 [employer] Glucose Tolerance Test During Pregnancy The glucose tolerance test (GTT) is a blood test used to determine if you have developed  a type of diabetes during pregnancy (gestational diabetes). This is when your body does not properly process sugar (glucose) in the food you eat, resulting in high blood glucose levels. Typically, a GTT is done after you have had a 1-hour glucose test with results that indicate you possibly have gestational diabetes. It may also be done if:  You have a  history of giving birth to very large babies or have experienced repeated fetal loss (stillbirth).  You have signs and symptoms of diabetes, such as: ? Changes in your vision. ? Tingling or numbness in your hands or feet. ? Changes in hunger, thirst, and urination not otherwise explained by your pregnancy.  The GTT lasts about 3 hours. You will be given a sugar-water solution to drink at the beginning of the test. You will have blood drawn before you drink the solution and then again 1, 2, and 3 hours after you drink it. You will not be allowed to eat or drink anything else during the test. You must remain at the testing location to make sure that your blood is drawn on time. You should also avoid exercising during the test, because exercise can alter test results. How do I prepare for this test? Eat normally for 3 days prior to the GTT test, including having plenty of carbohydrate-rich foods. Do not eat or drink anything except water during the final 12 hours before the test. In addition, your health care provider may ask you to stop taking certain medicines before the test. What do the results mean? It is your responsibility to obtain your test results. Ask the lab or department performing the test when and how you will get your results. Contact your health care provider to discuss any questions you have about your results. Range of Normal Values Ranges for normal values may vary among different labs and hospitals. You should always check with your health care provider after having lab work or other tests done to discuss whether your values are considered within normal limits. Normal levels of blood glucose are as follows:  Fasting: less than 105 mg/dL.  1 hour after drinking the solution: less than 190 mg/dL.  2 hours after drinking the solution: less than 165 mg/dL.  3 hours after drinking the solution: less than 145 mg/dL.  Some substances can interfere with GTT results. These may  include:  Blood pressure and heart failure medicines, including beta blockers, furosemide, and thiazides.  Anti-inflammatory medicines, including aspirin.  Nicotine.  Some psychiatric medicines.  Meaning of Results Outside Normal Value Ranges GTT test results that are above normal values may indicate a number of health problems, such as:  Gestational diabetes.  Acute stress response.  Cushing syndrome.  Tumors such as pheochromocytoma or glucagonoma.  Long-term kidney problems.  Pancreatitis.  Hyperthyroidism.  Current infection.  Discuss your test results with your health care provider. He or she will use the results to make a diagnosis and determine a treatment plan that is right for you. This information is not intended to replace advice given to you by your health care provider. Make sure you discuss any questions you have with your health care provider. Document Released: 06/01/2012 Document Revised: 05/08/2016 Document Reviewed: 04/07/2014 Elsevier Interactive Patient Education  2017 [employer].

## 2017-08-15 LAB — NUSWAB VAGINITIS PLUS (VG+)
CANDIDA ALBICANS, NAA: POSITIVE — AB
CHLAMYDIA TRACHOMATIS, NAA: NEGATIVE
Candida glabrata, NAA: NEGATIVE
MEGASPHAERA 1: HIGH {score} — AB
NEISSERIA GONORRHOEAE, NAA: NEGATIVE
Trich vag by NAA: NEGATIVE

## 2017-08-16 ENCOUNTER — Encounter: Payer: Self-pay | Admitting: Certified Nurse Midwife

## 2017-08-16 ENCOUNTER — Other Ambulatory Visit: Payer: Self-pay | Admitting: Certified Nurse Midwife

## 2017-08-16 MED ORDER — METRONIDAZOLE 500 MG PO TABS: 500.0000 mg | ORAL_TABLET | Freq: Two times a day (BID) | ORAL | 0 refills | Status: AC

## 2017-08-16 NOTE — Progress Notes (Signed)
PT NuSwab positive for BV and yeast. She was instructed at visit to use monistat for the yeast. Order placed for Flagyl. She was notified via my chart.   Doreene BurkeAnnie Reeanna Acri, CNM

## 2017-08-27 ENCOUNTER — Ambulatory Visit (INDEPENDENT_AMBULATORY_CARE_PROVIDER_SITE_OTHER): Payer: Medicaid Other | Admitting: Certified Nurse Midwife

## 2017-08-27 ENCOUNTER — Other Ambulatory Visit: Payer: Medicaid Other

## 2017-08-27 VITALS — BP 108/66 | HR 80 | Wt 215.6 lb

## 2017-08-27 DIAGNOSIS — Z130 Encounter for screening for diseases of the blood and blood-forming organs and certain disorders involving the immune mechanism: Secondary | ICD-10-CM

## 2017-08-27 DIAGNOSIS — Z3403 Encounter for supervision of normal first pregnancy, third trimester: Secondary | ICD-10-CM

## 2017-08-27 DIAGNOSIS — Z131 Encounter for screening for diabetes mellitus: Secondary | ICD-10-CM

## 2017-08-27 NOTE — Patient Instructions (Addendum)
Vaginal Delivery Vaginal delivery means that you will give birth by pushing your baby out of your birth canal (vagina). A team of health care providers will help you before, during, and after vaginal delivery. Birth experiences are unique for every woman and every pregnancy, and birth experiences vary depending on where you choose to give birth. What should I do to prepare for my baby's birth? Before your baby is born, it is important to talk with your health care provider about:  Your labor and delivery preferences. These may include: ? Medicines that you may be given. ? How you will manage your pain. This might include non-medical pain relief techniques or injectable pain relief such as epidural analgesia. ? How you and your baby will be monitored during labor and delivery. ? Who may be in the labor and delivery room with you. ? Your feelings about surgical delivery of your baby (cesarean delivery, or C-section) if this becomes necessary. ? Your feelings about receiving donated blood through an IV tube (blood transfusion) if this becomes necessary.  Whether you are able: ? To take pictures or videos of the birth. ? To eat during labor and delivery. ? To move around, walk, or change positions during labor and delivery.  What to expect after your baby is born, such as: ? Whether delayed umbilical cord clamping and cutting is offered. ? Who will care for your baby right after birth. ? Medicines or tests that may be recommended for your baby. ? Whether breastfeeding is supported in your hospital or birth center. ? How long you will be in the hospital or birth center.  How any medical conditions you have may affect your baby or your labor and delivery experience.  To prepare for your baby's birth, you should also:  Attend all of your health care visits before delivery (prenatal visits) as recommended by your health care provider. This is important.  Prepare your home for your baby's  arrival. Make sure that you have: ? Diapers. ? Baby clothing. ? Feeding equipment. ? Safe sleeping arrangements for you and your baby.  Install a car seat in your vehicle. Have your car seat checked by a certified car seat installer to make sure that it is installed safely.  Think about who will help you with your new baby at home for at least the first several weeks after delivery.  What can I expect when I arrive at the birth center or hospital? Once you are in labor and have been admitted into the hospital or birth center, your health care provider may:  Review your pregnancy history and any concerns you have.  Insert an IV tube into one of your veins. This is used to give you fluids and medicines.  Check your blood pressure, pulse, temperature, and heart rate (vital signs).  Check whether your bag of water (amniotic sac) has broken (ruptured).  Talk with you about your birth plan and discuss pain control options.  Monitoring Your health care provider may monitor your contractions (uterine monitoring) and your baby's heart rate (fetal monitoring). You may need to be monitored:  Often, but not continuously (intermittently).  All the time or for long periods at a time (continuously). Continuous monitoring may be needed if: ? You are taking certain medicines, such as medicine to relieve pain or make your contractions stronger. ? You have pregnancy or labor complications.  Monitoring may be done by:  Placing a special stethoscope or a handheld monitoring device on your abdomen to   check your baby's heartbeat, and feeling your abdomen for contractions. This method of monitoring does not continuously record your baby's heartbeat or your contractions.  Placing monitors on your abdomen (external monitors) to record your baby's heartbeat and the frequency and length of contractions. You may not have to wear external monitors all the time.  Placing monitors inside of your uterus  (internal monitors) to record your baby's heartbeat and the frequency, length, and strength of your contractions. ? Your health care provider may use internal monitors if he or she needs more information about the strength of your contractions or your baby's heart rate. ? Internal monitors are put in place by passing a thin, flexible wire through your vagina and into your uterus. Depending on the type of monitor, it may remain in your uterus or on your baby's head until birth. ? Your health care provider will discuss the benefits and risks of internal monitoring with you and will ask for your permission before inserting the monitors.  Telemetry. This is a type of continuous monitoring that can be done with external or internal monitors. Instead of having to stay in bed, you are able to move around during telemetry. Ask your health care provider if telemetry is an option for you.  Physical exam Your health care provider may perform a physical exam. This may include:  Checking whether your baby is positioned: ? With the head toward your vagina (head-down). This is most common. ? With the head toward the top of your uterus (head-up or breech). If your baby is in a breech position, your health care provider may try to turn your baby to a head-down position so you can deliver vaginally. If it does not seem that your baby can be born vaginally, your provider may recommend surgery to deliver your baby. In rare cases, you may be able to deliver vaginally if your baby is head-up (breech delivery). ? Lying sideways (transverse). Babies that are lying sideways cannot be delivered vaginally.  Checking your cervix to determine: ? Whether it is thinning out (effacing). ? Whether it is opening up (dilating). ? How low your baby has moved into your birth canal.  What are the three stages of labor and delivery?  Normal labor and delivery is divided into the following three stages: Stage 1  Stage 1 is the  longest stage of labor, and it can last for hours or days. Stage 1 includes: ? Early labor. This is when contractions may be irregular, or regular and mild. Generally, early labor contractions are more than 10 minutes apart. ? Active labor. This is when contractions get longer, more regular, more frequent, and more intense. ? The transition phase. This is when contractions happen very close together, are very intense, and may last longer than during any other part of labor.  Contractions generally feel mild, infrequent, and irregular at first. They get stronger, more frequent (about every 2-3 minutes), and more regular as you progress from early labor through active labor and transition.  Many women progress through stage 1 naturally, but you may need help to continue making progress. If this happens, your health care provider may talk with you about: ? Rupturing your amniotic sac if it has not ruptured yet. ? Giving you medicine to help make your contractions stronger and more frequent.  Stage 1 ends when your cervix is completely dilated to 4 inches (10 cm) and completely effaced. This happens at the end of the transition phase. Stage 2  Once   your cervix is completely effaced and dilated to 4 inches (10 cm), you may start to feel an urge to push. It is common for the body to naturally take a rest before feeling the urge to push, especially if you received an epidural or certain other pain medicines. This rest period may last for up to 1-2 hours, depending on your unique labor experience.  During stage 2, contractions are generally less painful, because pushing helps relieve contraction pain. Instead of contraction pain, you may feel stretching and burning pain, especially when the widest part of your baby's head passes through the vaginal opening (crowning).  Your health care provider will closely monitor your pushing progress and your baby's progress through the vagina during stage 2.  Your  health care provider may massage the area of skin between your vaginal opening and anus (perineum) or apply warm compresses to your perineum. This helps it stretch as the baby's head starts to crown, which can help prevent perineal tearing. ? In some cases, an incision may be made in your perineum (episiotomy) to allow the baby to pass through the vaginal opening. An episiotomy helps to make the opening of the vagina larger to allow more room for the baby to fit through.  It is very important to breathe and focus so your health care provider can control the delivery of your baby's head. Your health care provider may have you decrease the intensity of your pushing, to help prevent perineal tearing.  After delivery of your baby's head, the shoulders and the rest of the body generally deliver very quickly and without difficulty.  Once your baby is delivered, the umbilical cord may be cut right away, or this may be delayed for 1-2 minutes, depending on your baby's health. This may vary among health care providers, hospitals, and birth centers.  If you and your baby are healthy enough, your baby may be placed on your chest or abdomen to help maintain the baby's temperature and to help you bond with each other. Some mothers and babies start breastfeeding at this time. Your health care team will dry your baby and help keep your baby warm during this time.  Your baby may need immediate care if he or she: ? Showed signs of distress during labor. ? Has a medical condition. ? Was born too early (prematurely). ? Had a bowel movement before birth (meconium). ? Shows signs of difficulty transitioning from being inside the uterus to being outside of the uterus. If you are planning to breastfeed, your health care team will help you begin a feeding. Stage 3  The third stage of labor starts immediately after the birth of your baby and ends after you deliver the placenta. The placenta is an organ that develops  during pregnancy to provide oxygen and nutrients to your baby in the womb.  Delivering the placenta may require some pushing, and you may have mild contractions. Breastfeeding can stimulate contractions to help you deliver the placenta.  After the placenta is delivered, your uterus should tighten (contract) and become firm. This helps to stop bleeding in your uterus. To help your uterus contract and to control bleeding, your health care provider may: ? Give you medicine by injection, through an IV tube, by mouth, or through your rectum (rectally). ? Massage your abdomen or perform a vaginal exam to remove any blood clots that are left in your uterus. ? Empty your bladder by placing a thin, flexible tube (catheter) into your bladder. ? Encourage  you to breastfeed your baby. After labor is over, you and your baby will be monitored closely to ensure that you are both healthy until you are ready to go home. Your health care team will teach you how to care for yourself and your baby. This information is not intended to replace advice given to you by your health care provider. Make sure you discuss any questions you have with your health care provider. Document Released: 09/09/2008 Document Revised: 06/20/2016 Document Reviewed: 12/16/2015 Elsevier Interactive Patient Education  2018 [place] 2018 Prenatal Education Class Schedule Register at [email or web address] in the [place] or call Kerman Passey at [phone] 9:00a-5:00p M-F  Childbirth Preparation Certified Childbirth Educators teach this 5 week course.  Expectant parents are encouraged to take this class in their 3rd trimester, completing it by their 35-36th week. Meets in [facility], Lower Level.  Mondays Thursdays  7:00-9:00 p 7:00-9:00 p  July 23 - August 20 July 19 - August 16  September 17 - October 15 September 6 -October 4  November 5 - December 3 October 25 - November 29    No Class on Thanksgiving Day -November 22  Childbirth Preparation Refresher Course For those who have previously attended Prepared Childbirth Preparation classes, this class in incorporated into the 3rd and 4th classes in the Monday night childbirth series.  Course meets in the [facility]. Lower Level from 7:00p - 9:00p  August 6 & 13  October 1 & 8  November 19 & 26   Weekend Childbirth Waymon Amato Classes are held Saturday & Sunday, 1:00 5:00p Course meets in [facility], [place] Level  August 4 & 5  November 3 & 4    The [facility] Tours Free tours are held on the third Sunday of each month at 3 pm.  The tour meets in the third floor waiting area and will take approximately 30 minutes.  Tours are also included in Childbirth class series as well as Brother/Sister class.  An online virtual tour can be seen at [email or web address].         Breastfeeding & Infant Nutrition The course incorporates returning to work or school.  Breast milk collection and storage with basic breastfeeding and infant nutrition. This two-class course is held the 2nd and 3rd Tuesday of each month from 7:00 -9:00 pm.  Course meets in the [employer] [facility] Lower level  June 12 & 19 July-No Class  August 14 & 21 September 11 &18  September 11 & 18 October 9 &16  November 13 & 20 December 11 & 18   [employer] welcomes any mother for a social outing with other Moms to share experiences and challenges in an informal setting.  Meets the 1st Thursday and 3rd Thursday 11:30a-1:00 pm of each month in [facility] 3rd floor classroom.  No registration required.  Newborn Essentials This course covers bathing, diapering, swaddling and more with practice on lifelike dolls.  Participants will also learn safety tips and infant CPR (Not for certification).  It is held the 1st Wednesday of each month from 7:00p-9:00p in the [facility], Lower level.  June 6 July- No Class   August 1 September 5  October 3 November 7  December 7    Preparing Big Brother & Sister This one session course prepares children and their parents for the arrival of a new baby.  It is held on the 1st Tuesday of each month from 6:30p -  8:00p. Course meets in the [facility], Lower level.  July-No Class August 7  September 4 October 2  November 6 December 4   [employer] [place] for [employer] Dads This nationally acclaimed class helps expecting and new dads with the basic skills and confidence to bond with their infants, support their mates, and provide a safe and healthy home environment for their new family. Classes are held the 2nd Saturday of every month from 9:00a - 12:00 noon.  Course meets in the [facility] Lower level.  June 9 August 11  October 13 No Class in December  Common Medications Safe in Pregnancy  Acne:      Constipation:  Benzoyl Peroxide     Colace  Clindamycin      Dulcolax Suppository  Topica Erythromycin     Fibercon  Salicylic Acid      Metamucil         Miralax AVOID:        Senakot   Accutane    Cough:  Retin-A       Cough Drops  Tetracycline      Phenergan w/ Codeine if Rx  Minocycline      Robitussin (Plain & DM)  Antibiotics:     Crabs/Lice:  Ceclor       RID  Cephalosporins    AVOID:  E-Mycins      Kwell  Keflex  Macrobid/Macrodantin   Diarrhea:  Penicillin      Kao-Pectate  Zithromax      Imodium AD         PUSH FLUIDS AVOID:       Cipro     Fever:  Tetracycline      Tylenol (Regular or Extra  Minocycline       Strength)  Levaquin      Extra Strength-Do not          Exceed 8 tabs/24 hrs Caffeine:        <217m/day (equiv. To 1 cup of coffee or  approx. 3 12 oz sodas)         Gas: Cold/Hayfever:       Gas-X  Benadryl      Mylicon  Claritin       Phazyme  **Claritin-D        Chlor-Trimeton    Headaches:  Dimetapp      ASA-Free Excedrin  Drixoral-Non-Drowsy     Cold Compress  Mucinex (Guaifenasin)     Tylenol (Regular or  Extra  Sudafed/Sudafed-12 Hour     Strength)  **Sudafed PE Pseudoephedrine   Tylenol Cold & Sinus     Vicks Vapor Rub  Zyrtec  **AVOID if Problems With Blood Pressure         Heartburn: Avoid lying down for at least 1 hour after meals  Aciphex      Maalox     Rash:  Milk of Magnesia     Benadryl    Mylanta       1% Hydrocortisone Cream  Pepcid  Pepcid Complete   Sleep Aids:  Prevacid      Ambien   Prilosec       Benadryl  Rolaids       Chamomile Tea  Tums (Limit 4/day)     Unisom  Zantac       Tylenol PM         Warm milk-add vanilla or  Hemorrhoids:       Sugar for taste  Anusol/Anusol H.C.  (RX:  Analapram 2.5%)  Sugar Substitutes:  Hydrocortisone OTC     Ok in moderation  Preparation H      Tucks        Vaseline lotion applied to tissue with wiping    Herpes:     Throat:  Acyclovir      Oragel  Famvir  Valtrex     Vaccines:         Flu Shot Leg Cramps:       *Gardasil  Benadryl      Hepatitis A         Hepatitis B Nasal Spray:       Pneumovax  Saline Nasal Spray     Polio Booster         Tetanus Nausea:       Tuberculosis test or PPD  Vitamin B6 25 mg TID   AVOID:    Dramamine      *Gardasil  Emetrol       Live Poliovirus  Ginger Root 250 mg QID    MMR (measles, mumps &  High Complex Carbs @ Bedtime    rebella)  Sea Bands-Accupressure    Varicella (Chickenpox)  Unisom 1/2 tab TID     *No known complications           If received before Pain:         Known pregnancy;   Darvocet       Resume series after  Lortab        Delivery  Percocet    Yeast:   Tramadol      Femstat  Tylenol 3      Gyne-lotrimin  Ultram       Monistat  Vicodin           MISC:         All Sunscreens           Hair Coloring/highlights          Insect Repellant's          (Including DEET)         Mystic Tans

## 2017-08-27 NOTE — Progress Notes (Signed)
ROB-Pt doing well. Missed cup and unable to collect urine sample today. Glucola/CBC today. Blood transfusion consent reviewed and signed. Discussed home treatment measures for lower extremity swelling. Baby will be named "Sincere". Plans breastfeeding and epidural. Class schedule given. Reviewed red flag symptoms and when to call. RTC x 1 week for TDaP, none in office today due to impending storm. RTC x 2 weeks for 36 week cultures and ROB with Pattricia BossAnnie.

## 2017-08-28 LAB — CBC WITH DIFFERENTIAL/PLATELET
Basophils Absolute: 0 10*3/uL (ref 0.0–0.2)
Basos: 0 %
EOS (ABSOLUTE): 0.1 10*3/uL (ref 0.0–0.4)
EOS: 1 %
HEMATOCRIT: 35.2 % (ref 34.0–46.6)
HEMOGLOBIN: 11.5 g/dL (ref 11.1–15.9)
Immature Grans (Abs): 0 10*3/uL (ref 0.0–0.1)
Immature Granulocytes: 0 %
LYMPHS ABS: 1.3 10*3/uL (ref 0.7–3.1)
Lymphs: 20 %
MCH: 27.6 pg (ref 26.6–33.0)
MCHC: 32.7 g/dL (ref 31.5–35.7)
MCV: 84 fL (ref 79–97)
MONOS ABS: 0.5 10*3/uL (ref 0.1–0.9)
Monocytes: 8 %
NEUTROS ABS: 4.5 10*3/uL (ref 1.4–7.0)
Neutrophils: 71 %
PLATELETS: 194 10*3/uL (ref 150–379)
RBC: 4.17 x10E6/uL (ref 3.77–5.28)
RDW: 14.5 % (ref 12.3–15.4)
WBC: 6.3 10*3/uL (ref 3.4–10.8)

## 2017-08-28 LAB — GLUCOSE, 1 HOUR GESTATIONAL: Gestational Diabetes Screen: 85 mg/dL (ref 65–139)

## 2017-09-01 ENCOUNTER — Encounter: Payer: Medicaid Other | Admitting: Certified Nurse Midwife

## 2017-09-14 ENCOUNTER — Encounter: Payer: Medicaid Other | Admitting: Certified Nurse Midwife

## 2017-09-16 ENCOUNTER — Encounter: Payer: Medicaid Other | Admitting: Certified Nurse Midwife

## 2017-09-18 ENCOUNTER — Ambulatory Visit (INDEPENDENT_AMBULATORY_CARE_PROVIDER_SITE_OTHER): Payer: Medicaid Other | Admitting: Certified Nurse Midwife

## 2017-09-18 VITALS — BP 97/63 | HR 81 | Wt 222.1 lb

## 2017-09-18 DIAGNOSIS — Z3685 Encounter for antenatal screening for Streptococcus B: Secondary | ICD-10-CM

## 2017-09-18 DIAGNOSIS — Z23 Encounter for immunization: Secondary | ICD-10-CM | POA: Diagnosis not present

## 2017-09-18 DIAGNOSIS — Z3403 Encounter for supervision of normal first pregnancy, third trimester: Secondary | ICD-10-CM

## 2017-09-18 DIAGNOSIS — Z202 Contact with and (suspected) exposure to infections with a predominantly sexual mode of transmission: Secondary | ICD-10-CM

## 2017-09-18 LAB — POCT URINALYSIS DIPSTICK
BILIRUBIN UA: NEGATIVE
Glucose, UA: NEGATIVE
KETONES UA: NEGATIVE
Leukocytes, UA: NEGATIVE
Nitrite, UA: NEGATIVE
PH UA: 7 (ref 5.0–8.0)
Protein, UA: NEGATIVE
RBC UA: NEGATIVE
SPEC GRAV UA: 1.015 (ref 1.010–1.025)
Urobilinogen, UA: 0.2 E.U./dL

## 2017-09-18 NOTE — Progress Notes (Signed)
ROB, pt has missed her last appointment. GBS and gc/Chlamydia today. SVE 1 cm  Thick. Pt states that she lives closer to [place] so she will probably go there. Informed her that we do not deliver at that hospital and that she would be assigned someone to deliver her she verbalizes understanding. Labor precautions reviewed follow up 1 wk  Doreene Burke, CNM

## 2017-09-18 NOTE — Patient Instructions (Signed)

## 2017-09-18 NOTE — Progress Notes (Signed)
Pt is here for a routine OB visit. Is due for Tdap and cultures. Refused Flu shot.

## 2017-09-20 LAB — STREP GP B NAA: Strep Gp B NAA: NEGATIVE

## 2017-09-21 ENCOUNTER — Encounter: Payer: Self-pay | Admitting: Certified Nurse Midwife

## 2017-09-22 LAB — GC/CHLAMYDIA PROBE AMP
Chlamydia trachomatis, NAA: NEGATIVE
NEISSERIA GONORRHOEAE BY PCR: NEGATIVE

## 2017-09-25 ENCOUNTER — Ambulatory Visit (INDEPENDENT_AMBULATORY_CARE_PROVIDER_SITE_OTHER): Payer: Medicaid Other | Admitting: Certified Nurse Midwife

## 2017-09-25 ENCOUNTER — Encounter: Payer: Self-pay | Admitting: Certified Nurse Midwife

## 2017-09-25 VITALS — BP 100/69 | HR 81 | Wt 230.2 lb

## 2017-09-25 DIAGNOSIS — Z3403 Encounter for supervision of normal first pregnancy, third trimester: Secondary | ICD-10-CM

## 2017-09-25 LAB — POCT URINALYSIS DIPSTICK
Bilirubin, UA: NEGATIVE
Blood, UA: NEGATIVE
Glucose, UA: NEGATIVE
Ketones, UA: NEGATIVE
Leukocytes, UA: NEGATIVE
Nitrite, UA: NEGATIVE
Protein, UA: NEGATIVE
Spec Grav, UA: 1.025
Urobilinogen, UA: 0.2 U/dL
pH, UA: 6

## 2017-09-25 NOTE — Patient Instructions (Signed)

## 2017-09-25 NOTE — Progress Notes (Signed)
ROB, doing well. No complaints . Discussed labor precautions. Follow up 1 wk.   Doreene Burke, CNM

## 2017-09-25 NOTE — Progress Notes (Signed)
ROB- Mom is doing ok but has been experiencing some numbness and tingling in her feet denies pain in feet Declines-flu

## 2017-10-02 ENCOUNTER — Ambulatory Visit (INDEPENDENT_AMBULATORY_CARE_PROVIDER_SITE_OTHER): Payer: Medicaid Other | Admitting: Obstetrics and Gynecology

## 2017-10-02 VITALS — BP 110/73 | HR 84 | Wt 232.4 lb

## 2017-10-02 DIAGNOSIS — F1210 Cannabis abuse, uncomplicated: Secondary | ICD-10-CM

## 2017-10-02 DIAGNOSIS — Z3493 Encounter for supervision of normal pregnancy, unspecified, third trimester: Secondary | ICD-10-CM

## 2017-10-02 LAB — POCT URINALYSIS DIPSTICK
Bilirubin, UA: NEGATIVE
Blood, UA: NEGATIVE
Glucose, UA: NEGATIVE
KETONES UA: NEGATIVE
LEUKOCYTES UA: NEGATIVE
NITRITE UA: NEGATIVE
PH UA: 6 (ref 5.0–8.0)
PROTEIN UA: NEGATIVE
Spec Grav, UA: 1.01 (ref 1.010–1.025)
UROBILINOGEN UA: 0.2 U/dL

## 2017-10-02 NOTE — Progress Notes (Signed)
ROB- pt is doing well, having some pelvic pressure 

## 2017-10-02 NOTE — Patient Instructions (Signed)
Nonstress Test The nonstress test is a procedure that monitors the fetus's heartbeat. The test will monitor the heartbeat when the fetus is at rest and while the fetus is moving. In a healthy fetus, there will be an increase in fetal heart rate when the fetus moves or kicks. The heart rate will decrease at rest. This test helps determine if the fetus is healthy. Your health care provider will look at a number of patterns in the heart rate tracing to make sure your baby is thriving. If there is concern, your health care provider may order additional tests or may suggest another course of action. This test is often done in the third trimester and can help determine if an early delivery is needed and safe. Common reasons to have this test are:  You are past your due date.  You have a high-risk pregnancy.  You are feeling less movement than normal.  You have lost a pregnancy in the past.  Your health care provider suspects fetal growth problems.  You have too much or too little amniotic fluid.  What happens before the procedure?  Eat a meal right before the test or as directed by your health care provider. Food may help stimulate fetal movements.  Use the restroom right before the test. What happens during the procedure?  Two belts will be placed around your abdomen. These belts have monitors attached to them. One records the fetal heart rate and the other records uterine contractions.  You may be asked to lie down on your side or to stay sitting upright.  You may be given a button to press when you feel movement.  The fetal heartbeat is listened to and watched on a screen. The heartbeat is recorded on a sheet of paper.  If the fetus seems to be sleeping, you may be asked to drink some juice or soda, gently press your abdomen, or make some noise to wake the fetus. What happens after the procedure? Your health care provider will discuss the test results with you and make recommendations  for the near future.  This information is not intended to replace advice given to you by your health care provider. Make sure you discuss any questions you have with your health care provider. This information is not intended to replace advice given to you by your health care provider. Make sure you discuss any questions you have with your health care provider. Document Released: 11/21/2002 Document Revised: 10/31/2016 Document Reviewed: 01/04/2013 Elsevier Interactive Patient Education  2018 [employer] Inc[employer]  

## 2017-10-02 NOTE — Progress Notes (Signed)
ROB-postdates care discussed, also talked about volunteer duolas.

## 2017-10-05 ENCOUNTER — Encounter: Payer: Self-pay | Admitting: Certified Nurse Midwife

## 2017-10-05 ENCOUNTER — Ambulatory Visit (INDEPENDENT_AMBULATORY_CARE_PROVIDER_SITE_OTHER): Payer: Medicaid Other | Admitting: Certified Nurse Midwife

## 2017-10-05 ENCOUNTER — Other Ambulatory Visit: Payer: Medicaid Other

## 2017-10-05 VITALS — BP 113/65 | HR 85 | Wt 236.4 lb

## 2017-10-05 DIAGNOSIS — Z3493 Encounter for supervision of normal pregnancy, unspecified, third trimester: Secondary | ICD-10-CM | POA: Diagnosis not present

## 2017-10-05 LAB — POCT URINALYSIS DIPSTICK
Bilirubin, UA: NEGATIVE
Blood, UA: NEGATIVE
Glucose, UA: NEGATIVE
KETONES UA: NEGATIVE
LEUKOCYTES UA: NEGATIVE
Nitrite, UA: NEGATIVE
PROTEIN UA: NEGATIVE
Spec Grav, UA: 1.015 (ref 1.010–1.025)
Urobilinogen, UA: 0.2 E.U./dL
pH, UA: 6 (ref 5.0–8.0)

## 2017-10-05 LAB — MONITOR DRUG PROFILE 14(MW)
AMPHETAMINE SCREEN URINE: NEGATIVE ng/mL
BARBITURATE SCREEN URINE: NEGATIVE ng/mL
BENZODIAZEPINE SCREEN, URINE: NEGATIVE ng/mL
BUPRENORPHINE, URINE: NEGATIVE ng/mL
CANNABINOIDS UR QL SCN: NEGATIVE ng/mL
Cocaine (Metab) Scrn, Ur: NEGATIVE ng/mL
Creatinine(Crt), U: 58.9 mg/dL (ref 20.0–300.0)
FENTANYL, URINE: NEGATIVE pg/mL
Meperidine Screen, Urine: NEGATIVE ng/mL
Methadone Screen, Urine: NEGATIVE ng/mL
OPIATE SCREEN URINE: NEGATIVE ng/mL
OXYCODONE+OXYMORPHONE UR QL SCN: NEGATIVE ng/mL
PH UR, DRUG SCRN: 5.6 (ref 4.5–8.9)
PHENCYCLIDINE QUANTITATIVE URINE: NEGATIVE ng/mL
Propoxyphene Scrn, Ur: NEGATIVE ng/mL
SPECIFIC GRAVITY: 1.011
TRAMADOL SCREEN, URINE: NEGATIVE ng/mL

## 2017-10-05 NOTE — Patient Instructions (Signed)
Augmentation of Labor Augmentation of labor is when steps are taken to stimulate and strengthen uterine contractions during labor. This may be done when the contractions have slowed down or stopped, delaying progress of labor and delivery of the baby. Before beginning augmentation of labor, the health care provider will evaluate the condition of the mother and baby, the size and position of the baby, and the size of the birth canal. What are the reasons for labor augmentation? Reasons for augmentation of labor include:  Slow labor (prolonged first and second stage of labor) that has been associated with increased maternal risks, such as chorioamnionitis, postpartum hemorrhage, operative vaginal delivery, or third-degree or fourth-degree perineal lacerations.  Decreased average length of labor.  What methods are used for labor augmentation? Various methods may be used for augmentation of labor, including:  Oxytocin medicine. This medicine stimulates contractions. It is given through an IV access tube inserted into a vein.  Breaking the fluid-filled sac that surrounds the fetus (amniotic sac).  Stripping the membranes. The health care provider separates amniotic sac tissue from the cervix, causing the release of a hormone called progesterone that can stimulate uterine contractions.  Nipple stimulation.  Stimulation of certain pressure points on the ankles.  Manual or mechanical dilation of the cervix.  What are the risks associated with labor augmentation?  Overstimulation of the uterine contractions (continuous, prolonged, very strong contractions), causing fetal distress.  Increased chance of infection for the mother and baby.  Uterine tearing (rupture).  Breaking off (abruption) of the placenta.  Increased chance of cesarean, forceps, or vacuum delivery. What are some reasons for not doing labor augmentation? Augmentation of labor should not be done if:  The baby is too big for  the birth canal. This can be confirmed by ultrasonography.  The umbilical cord drops in front of the baby's head or breech part (prolapsed cord).  The mother had a previous cesarean delivery with a vertical incision in the uterus (or the kind of incision used is not known). High dose oxytocin should not be used if the mother had a previous cesarean delivery of any kind.  The mother had previous surgery on or into the uterus.  The mother has herpes.  The mother has cervical cancer.  The baby is lying sideways.  The mother's pelvis is deformed.  The mother is pregnant with more than two babies.  This information is not intended to replace advice given to you by your health care provider. Make sure you discuss any questions you have with your health care provider. Document Released: 05/26/2007 Document Revised: 05/14/2016 Document Reviewed: 06/30/2013 Elsevier Interactive Patient Education  2017 Elsevier Inc.  

## 2017-10-05 NOTE — Progress Notes (Signed)
Rob and nst- NST performed today was reviewed and was found to be reactive. Baseline 135-140 with moderate variability, accelerations present and no decelerations noted.  Discussed induction of labor options at 41 weeks. Pt would really like labor to happen naturally, so education regarding home preparation techniques.  Reviewed red flag symptoms and when to call.RTC x Thursday for BPP/Growth US and ROB with Pattricia BossAnnie.   NONSTRESS TEST INTERPRETATION  INDICATIONS: Post dates  FHR baseline: 135-140 RESULTS:Reactive COMMENTS: Postdates pregnancy   PLAN: 1. Continue fetal kick counts twice a day. 2. Continue antepartum testing as scheduled-Biweekly

## 2017-10-08 ENCOUNTER — Encounter: Payer: Self-pay | Admitting: Certified Nurse Midwife

## 2017-10-08 ENCOUNTER — Ambulatory Visit (INDEPENDENT_AMBULATORY_CARE_PROVIDER_SITE_OTHER): Payer: Medicaid Other | Admitting: Certified Nurse Midwife

## 2017-10-08 ENCOUNTER — Ambulatory Visit (INDEPENDENT_AMBULATORY_CARE_PROVIDER_SITE_OTHER): Payer: Medicaid Other

## 2017-10-08 VITALS — BP 117/75 | HR 89 | Wt 236.6 lb

## 2017-10-08 DIAGNOSIS — Z3493 Encounter for supervision of normal pregnancy, unspecified, third trimester: Secondary | ICD-10-CM

## 2017-10-08 NOTE — Patient Instructions (Signed)

## 2017-10-08 NOTE — Progress Notes (Signed)
  ROB doing well. Discussed remainder of prenatal care. Pt would like to go into labor on her own but is not opposed to induction next week. Ultrasound today for  Growth for Post-Dates. Afi low normal. See below for full report. Discussed self help methods for labor. She agrees to plan she will return on Monday for NST and ROB if labor has not ensued. Labor induction to be scheduled next week. Paper faxed to L&D , Reviewed fetal movement and labor precautions.   Doreene BurkeAnnie Tuwana Kapaun, CNM   ULTRASOUND REPORT  Location: [facility] Date of Service: 10/08/17  Indications: Growth for Post-Dates Findings:  Singleton intrauterine pregnancy is visualized with FHR at 136 BPM. Biometrics give an (U/S) Gestational age of 838 6/7 weeks and an (U/S) EDD of 10/16/17; this correlates with the clinically established EDD of 10/03/17.  Very difficult to obtain an accurate head measurement due to fetal position. Fetal presentation is vertex.  EFW: 3802 grams (8lb 6oz).  67th percentile. Placenta: Posterior and grade 2. AFI: Within the lower limits of normal at 6.3cm.   Impression: 1. 38 6/7 week Viable Singleton Intrauterine pregnancy by U/S. 2. (U/S) EDD is consistent with Clinically established (LMP) EDD of 10/03/17. 3. EFW = 3802 grams (8lb 6oz).  67th percentile. 4. AFI within the lower limits of normal at 6.3cm.  Recommendations: 1.Clinical correlation with the patient's History and Physical Exam.   Kari BaarsJill Long, RDMS

## 2017-10-09 ENCOUNTER — Inpatient Hospital Stay
Admission: EM | Admit: 2017-10-09 | Discharge: 2017-10-11 | DRG: 788 | Disposition: A | Discharge status: Home / Self Care | Payer: Medicaid Other | Attending: Certified Nurse Midwife | Admitting: Certified Nurse Midwife

## 2017-10-09 ENCOUNTER — Inpatient Hospital Stay: Payer: Medicaid Other | Admitting: Anesthesiology

## 2017-10-09 ENCOUNTER — Telehealth: Payer: Self-pay | Admitting: Certified Nurse Midwife

## 2017-10-09 ENCOUNTER — Encounter
Admission: EM | Disposition: A | Discharge status: Home / Self Care | Payer: Self-pay | Source: Home / Self Care | Attending: Certified Nurse Midwife

## 2017-10-09 DIAGNOSIS — Z3483 Encounter for supervision of other normal pregnancy, third trimester: Secondary | ICD-10-CM | POA: Diagnosis present

## 2017-10-09 DIAGNOSIS — Z3A40 40 weeks gestation of pregnancy: Secondary | ICD-10-CM

## 2017-10-09 DIAGNOSIS — R001 Bradycardia, unspecified: Secondary | ICD-10-CM

## 2017-10-09 HISTORY — DX: Other specified health status: Z78.9

## 2017-10-09 LAB — CBC
HCT: 36.5 % (ref 35.0–47.0)
HEMOGLOBIN: 12.2 g/dL (ref 12.0–16.0)
MCH: 27.4 pg (ref 26.0–34.0)
MCHC: 33.5 g/dL (ref 32.0–36.0)
MCV: 81.8 fL (ref 80.0–100.0)
Platelets: 202 10*3/uL (ref 150–440)
RBC: 4.46 MIL/uL (ref 3.80–5.20)
RDW: 14.9 % — ABNORMAL HIGH (ref 11.5–14.5)
WBC: 7.1 10*3/uL (ref 3.6–11.0)

## 2017-10-09 LAB — TYPE AND SCREEN
ABO/RH(D): O POS
Antibody Screen: NEGATIVE

## 2017-10-09 LAB — ROM PLUS (ARMC ONLY): Rom Plus: POSITIVE

## 2017-10-09 SURGERY — Surgical Case
Anesthesia: Epidural | Site: Abdomen | Wound class: Clean Contaminated

## 2017-10-09 MED ORDER — PHENYLEPHRINE HCL 10 MG/ML IJ SOLN
INTRAMUSCULAR | Status: AC
  Filled 2017-10-09: qty 1

## 2017-10-09 MED ORDER — SODIUM CHLORIDE 0.9 % IV SOLN
INTRAVENOUS | Status: DC | PRN
  Administered 2017-10-09: 30 ug/min via INTRAVENOUS

## 2017-10-09 MED ORDER — SODIUM CHLORIDE 0.9 % IV SOLN
INTRAVENOUS | Status: DC | PRN
  Administered 2017-10-09 (×2): 5 mL via EPIDURAL

## 2017-10-09 MED ORDER — MISOPROSTOL 25 MCG QUARTER TABLET: 25.0000 ug | ORAL_TABLET | ORAL | Status: DC | PRN

## 2017-10-09 MED ORDER — LIDOCAINE HCL (PF) 1 % IJ SOLN
INTRAMUSCULAR | Status: DC | PRN
  Administered 2017-10-09: 3 mL via SUBCUTANEOUS

## 2017-10-09 MED ORDER — ONDANSETRON HCL 4 MG/2ML IJ SOLN: 4.0000 mg | Freq: Once | INTRAMUSCULAR | Status: DC | PRN

## 2017-10-09 MED ORDER — LACTATED RINGERS IV SOLN: INTRAVENOUS | Status: DC

## 2017-10-09 MED ORDER — LACTATED RINGERS IV SOLN: 500.0000 mL | Freq: Once | INTRAVENOUS | Status: DC

## 2017-10-09 MED ORDER — CHLOROPROCAINE HCL (PF) 3 % IJ SOLN
INTRAMUSCULAR | Status: AC
  Filled 2017-10-09: qty 20

## 2017-10-09 MED ORDER — LIDOCAINE 5 % EX PTCH
MEDICATED_PATCH | CUTANEOUS | Status: AC
  Filled 2017-10-09: qty 1

## 2017-10-09 MED ORDER — FENTANYL CITRATE (PF) 100 MCG/2ML IJ SOLN
INTRAMUSCULAR | Status: AC
Start: 2017-10-09 — End: 2017-10-09
  Administered 2017-10-09: 50 ug via INTRAVENOUS
  Filled 2017-10-09: qty 2

## 2017-10-09 MED ORDER — FENTANYL CITRATE (PF) 100 MCG/2ML IJ SOLN
25.0000 ug | INTRAMUSCULAR | Status: DC | PRN
  Administered 2017-10-09 – 2017-10-10 (×3): 50 ug via INTRAVENOUS
  Filled 2017-10-09: qty 2

## 2017-10-09 MED ORDER — OXYTOCIN 40 UNITS IN LACTATED RINGERS INFUSION - SIMPLE MED
1.0000 m[IU]/min | INTRAVENOUS | Status: DC
  Administered 2017-10-09: 2 m[IU]/min via INTRAVENOUS
  Filled 2017-10-09: qty 1000

## 2017-10-09 MED ORDER — OXYCODONE-ACETAMINOPHEN 5-325 MG PO TABS: 2.0000 | ORAL_TABLET | ORAL | Status: DC | PRN

## 2017-10-09 MED ORDER — ONDANSETRON HCL 4 MG/2ML IJ SOLN: 4.0000 mg | Freq: Four times a day (QID) | INTRAMUSCULAR | Status: DC | PRN

## 2017-10-09 MED ORDER — DEXTROSE 5 % IV SOLN
2.0000 g | Freq: Once | INTRAVENOUS | Status: DC
  Filled 2017-10-09: qty 20

## 2017-10-09 MED ORDER — LIDOCAINE HCL (PF) 1 % IJ SOLN: 30.0000 mL | INTRAMUSCULAR | Status: DC | PRN

## 2017-10-09 MED ORDER — ACETAMINOPHEN 325 MG PO TABS: 650.0000 mg | ORAL_TABLET | ORAL | Status: DC | PRN

## 2017-10-09 MED ORDER — OXYTOCIN BOLUS FROM INFUSION: 500.0000 mL | Freq: Once | INTRAVENOUS | Status: DC

## 2017-10-09 MED ORDER — FLEET ENEMA 7-19 GM/118ML RE ENEM: 1.0000 | ENEMA | Freq: Once | RECTAL | Status: DC

## 2017-10-09 MED ORDER — DIPHENHYDRAMINE HCL 50 MG/ML IJ SOLN: 12.5000 mg | INTRAMUSCULAR | Status: DC | PRN

## 2017-10-09 MED ORDER — HYDROMORPHONE HCL 1 MG/ML IJ SOLN
INTRAMUSCULAR | Status: DC | PRN
  Administered 2017-10-09: 0.5 mg via INTRAVENOUS

## 2017-10-09 MED ORDER — TERBUTALINE SULFATE 1 MG/ML IJ SOLN
0.2500 mg | Freq: Once | INTRAMUSCULAR | Status: AC | PRN
  Administered 2017-10-09: 0.25 mg via SUBCUTANEOUS
  Filled 2017-10-09: qty 1

## 2017-10-09 MED ORDER — FENTANYL 2.5 MCG/ML W/ROPIVACAINE 0.15% IN NS 100 ML EPIDURAL (ARMC)
EPIDURAL | Status: AC
  Filled 2017-10-09: qty 100

## 2017-10-09 MED ORDER — LIDOCAINE HCL (PF) 2 % IJ SOLN
INTRAMUSCULAR | Status: DC | PRN
  Administered 2017-10-09 (×2): 5 mL via EPIDURAL
  Administered 2017-10-09: 3 mL via EPIDURAL
  Administered 2017-10-09: 2 mL via EPIDURAL

## 2017-10-09 MED ORDER — HYDROMORPHONE HCL 1 MG/ML IJ SOLN
INTRAMUSCULAR | Status: AC
  Filled 2017-10-09: qty 1

## 2017-10-09 MED ORDER — SOD CITRATE-CITRIC ACID 500-334 MG/5ML PO SOLN
30.0000 mL | ORAL | Status: DC | PRN
  Administered 2017-10-09: 30 mL via ORAL
  Filled 2017-10-09: qty 15

## 2017-10-09 MED ORDER — ONDANSETRON HCL 4 MG/2ML IJ SOLN
INTRAMUSCULAR | Status: AC
  Filled 2017-10-09: qty 2

## 2017-10-09 MED ORDER — MEPERIDINE HCL 25 MG/ML IJ SOLN: 6.2500 mg | INTRAMUSCULAR | Status: DC | PRN

## 2017-10-09 MED ORDER — EPHEDRINE 5 MG/ML INJ: 10.0000 mg | INTRAVENOUS | Status: DC | PRN

## 2017-10-09 MED ORDER — PHENYLEPHRINE 40 MCG/ML (10ML) SYRINGE FOR IV PUSH (FOR BLOOD PRESSURE SUPPORT): 80.0000 ug | PREFILLED_SYRINGE | INTRAVENOUS | Status: DC | PRN

## 2017-10-09 MED ORDER — FENTANYL 2.5 MCG/ML W/ROPIVACAINE 0.15% IN NS 100 ML EPIDURAL (ARMC)
12.0000 mL/h | EPIDURAL | Status: DC
Start: 2017-10-09 — End: 2017-10-09
  Administered 2017-10-09: 12 mL/h via EPIDURAL

## 2017-10-09 MED ORDER — LIDOCAINE 5 % EX PTCH
MEDICATED_PATCH | CUTANEOUS | Status: DC | PRN
  Administered 2017-10-09: 1 via TRANSDERMAL

## 2017-10-09 MED ORDER — BUTORPHANOL TARTRATE 2 MG/ML IJ SOLN
1.0000 mg | INTRAMUSCULAR | Status: DC | PRN
  Administered 2017-10-09: 1 mg via INTRAVENOUS
  Filled 2017-10-09: qty 1

## 2017-10-09 MED ORDER — SODIUM CHLORIDE FLUSH 0.9 % IV SOLN
INTRAVENOUS | Status: AC
  Filled 2017-10-09: qty 50

## 2017-10-09 MED ORDER — OXYTOCIN 40 UNITS IN LACTATED RINGERS INFUSION - SIMPLE MED
2.5000 [IU]/h | INTRAVENOUS | Status: DC
  Administered 2017-10-09: 600 mL via INTRAVENOUS
  Filled 2017-10-09: qty 1000

## 2017-10-09 MED ORDER — PROPOFOL 10 MG/ML IV BOLUS
INTRAVENOUS | Status: AC
  Filled 2017-10-09: qty 20

## 2017-10-09 MED ORDER — CEFAZOLIN SODIUM 10 G IJ SOLR
2.0000 g | Freq: Once | INTRAMUSCULAR | Status: AC
Start: 2017-10-09 — End: 2017-10-09
  Administered 2017-10-09: 2 g via INTRAVENOUS
  Filled 2017-10-09: qty 2000

## 2017-10-09 MED ORDER — LIDOCAINE-EPINEPHRINE (PF) 1.5 %-1:200000 IJ SOLN
INTRAMUSCULAR | Status: DC | PRN
  Administered 2017-10-09: 3 mL via EPIDURAL

## 2017-10-09 MED ORDER — OXYCODONE-ACETAMINOPHEN 5-325 MG PO TABS: 1.0000 | ORAL_TABLET | ORAL | Status: DC | PRN

## 2017-10-09 MED ORDER — ONDANSETRON HCL 4 MG/2ML IJ SOLN
INTRAMUSCULAR | Status: DC | PRN
Start: 2017-10-09 — End: 2017-10-09
  Administered 2017-10-09: 4 mg via INTRAVENOUS

## 2017-10-09 MED ORDER — LACTATED RINGERS IV SOLN
500.0000 mL | INTRAVENOUS | Status: DC | PRN
  Administered 2017-10-09: 500 mL via INTRAVENOUS

## 2017-10-09 MED ORDER — ZOLPIDEM TARTRATE 5 MG PO TABS: 5.0000 mg | ORAL_TABLET | Freq: Every evening | ORAL | Status: DC | PRN

## 2017-10-09 MED ORDER — LACTATED RINGERS IV SOLN
INTRAVENOUS | Status: DC
  Administered 2017-10-09: 16:00:00 via INTRAVENOUS

## 2017-10-09 SURGICAL SUPPLY — 28 items
ADHESIVE MASTISOL STRL (MISCELLANEOUS) IMPLANT
BAG COUNTER SPONGE EZ (MISCELLANEOUS) ×2 IMPLANT
CANISTER SUCT 3000ML PPV (MISCELLANEOUS) ×3 IMPLANT
CELL SAVER LIPIGURD (MISCELLANEOUS) IMPLANT
CHLORAPREP W/TINT 26ML (MISCELLANEOUS) ×6 IMPLANT
COUNTER SPONGE BAG EZ (MISCELLANEOUS) ×1
DRSG TELFA 3X8 NADH (GAUZE/BANDAGES/DRESSINGS) ×3 IMPLANT
ELECT CAUTERY BLADE 6.4 (BLADE) ×3 IMPLANT
ELECT COAG BIPOLAR CYL 1.2MMM (ELECTROSURGICAL) ×3
ELECTRODE COAG BIPLR CYL 1.2MM (ELECTROSURGICAL) ×1 IMPLANT
EXTRT SYSTEM ALEXIS 14CM (MISCELLANEOUS)
GAUZE SPONGE 4X4 12PLY STRL (GAUZE/BANDAGES/DRESSINGS) ×3 IMPLANT
GLOVE INDICATOR 7.0 STRL GRN (GLOVE) ×6 IMPLANT
GLOVE ORTHO TXT STRL SZ7.5 (GLOVE) ×6 IMPLANT
GLOVE PROTEXIS LATEX SZ 7.5 (GLOVE) ×6 IMPLANT
GOWN STRL REUS W/ TWL LRG LVL3 (GOWN DISPOSABLE) ×2 IMPLANT
GOWN STRL REUS W/TWL LRG LVL3 (GOWN DISPOSABLE) ×4
KIT RM TURNOVER STRD PROC AR (KITS) ×3 IMPLANT
NS IRRIG 1000ML POUR BTL (IV SOLUTION) ×3 IMPLANT
PACK C SECTION AR (MISCELLANEOUS) ×3 IMPLANT
PAD OB MATERNITY 4.3X12.25 (PERSONAL CARE ITEMS) ×6 IMPLANT
PAD PREP 24X41 OB/GYN DISP (PERSONAL CARE ITEMS) ×3 IMPLANT
RTRCTR C-SECT PINK 25CM LRG (MISCELLANEOUS) ×3 IMPLANT
SPONGE LAP 18X18 5 PK (GAUZE/BANDAGES/DRESSINGS) ×3 IMPLANT
SUT VIC AB 0 CTX 36 (SUTURE) ×4
SUT VIC AB 0 CTX36XBRD ANBCTRL (SUTURE) ×2 IMPLANT
SUT VIC AB 1 CT1 36 (SUTURE) ×6 IMPLANT
SUT VICRYL+ 3-0 36IN CT-1 (SUTURE) ×6 IMPLANT

## 2017-10-09 NOTE — Transfer of Care (Signed)
  Anesthesia Post-op Note  Patient: Civil engineer, contractingKenyata Rollins  Procedure(s) Performed: Procedure(s): CESAREAN SECTION (N/A)  Patient Location: Mother/Baby  Anesthesia Type:Epidural  Level of Consciousness: awake, alert  and oriented  Airway and Oxygen Therapy: Patient Spontanous Breathing  Post-op Pain: 0 /10  Post-op Assessment: Post-op Vital signs reviewed, Patent Airway, No signs of Nausea or vomiting and Pain level controlled  Post-op Vital Signs: Reviewed and stable  Last Vitals:  Vitals:   10/09/17 2259 10/09/17 2300  BP: (!) 116/55 (!) (P) 116/55  Pulse:  (!) (P) 120  Resp: 14 (P) 19  Temp: 36.9 C (P) 36.9 C  SpO2: 98%     Complications: No apparent anesthesia complications

## 2017-10-09 NOTE — Progress Notes (Signed)
Pt called out to the nurses station stating she thought her water had broken. Nitrazene was negative.

## 2017-10-09 NOTE — Progress Notes (Signed)
Grayce SessionsKenyata Cope is a 26 y.o. G1P0 at 609w6d by LMP admitted for active labor, rupture of membranes  Subjective:  Pt in hands and knees, expressing concern about fetal heart rate tracing. FOB and other family members at bedside.   Objective:  Temp:  [97.7 F (36.5 C)] 97.7 F (36.5 C) (10/26 0946) Pulse Rate:  [80-130] 120 (10/26 2050) Resp:  [18] 18 (10/26 0946) BP: (95-135)/(59-90) 95/67 (10/26 2050) SpO2:  [98 %-100 %] 98 % (10/26 2050) Weight:  [236 lb (107 kg)] 236 lb (107 kg) (10/26 0946)  FHT:  FHR: 135 bpm, variability: moderate,  accelerations:  Present,  decelerations:  Present variable, prolong, bradycardia  UC:   irregular, every two (2) to nine (9) minutes, soft resting tone, adequate fluid return  SVE:   Dilation: 6 Effacement (%): 90 Station: -1 Exam by:: Willodean RosenthalM. Lawhorn, CNM  Labs: Lab Results  Component Value Date   WBC 7.1 10/09/2017   HGB 12.2 10/09/2017   HCT 36.5 10/09/2017   MCV 81.8 10/09/2017   PLT 202 10/09/2017    Assessment:  Grayce SessionsKenyata Kugelman is a 26 y.o. G1P0 at 539w6d being admitted for labor, Rh positive, GBS negative  FHR Category II  Plan:  Fetal bradycardia persists despite resuscitation measures including dose of terbutaline.   Verbal and written consent obtained for cesarean section. Risk and benefits reviewed, pt verbalized understanding.   Stat c-section called, Dr Logan BoresEvans notified.    Gunnar BullaJenkins Michelle Lawhorn, CNM 10/09/2017, 9:48 PM

## 2017-10-09 NOTE — Anesthesia Procedure Notes (Signed)
Epidural Patient location during procedure: OB Start time: 10/09/2017 5:49 PM End time: 10/09/2017 5:58 PM  Staffing Anesthesiologist: Lenard SimmerKARENZ, Jahne Krukowski Performed: anesthesiologist   Preanesthetic Checklist Completed: patient identified, site marked, surgical consent, pre-op evaluation, timeout performed, IV checked, risks and benefits discussed and monitors and equipment checked  Epidural Patient position: sitting Prep: ChloraPrep Patient monitoring: heart rate, continuous pulse ox and blood pressure Approach: midline Location: L3-L4 Injection technique: LOR saline  Needle:  Needle type: Tuohy  Needle gauge: 17 G Needle length: 9 cm and 9 Needle insertion depth: 7 cm Catheter type: closed end flexible Catheter size: 19 Gauge Catheter at skin depth: 12 cm Test dose: negative and 1.5% lidocaine with Epi 1:200 K  Assessment Sensory level: T10 Events: blood not aspirated, injection not painful, no injection resistance, negative IV test and no paresthesia  Additional Notes Pt. Evaluated and documentation done after procedure finished. Patient identified. Risks/Benefits/Options discussed with patient including but not limited to bleeding, infection, nerve damage, paralysis, failed block, incomplete pain control, headache, blood pressure changes, nausea, vomiting, reactions to medication both or allergic, itching and postpartum back pain. Confirmed with bedside nurse the patient's most recent platelet count. Confirmed with patient that they are not currently taking any anticoagulation, have any bleeding history or any family history of bleeding disorders. Patient expressed understanding and wished to proceed. All questions were answered. Sterile technique was used throughout the entire procedure. Please see nursing notes for vital signs. Test dose was given through epidural catheter and negative prior to continuing to dose epidural or start infusion. Warning signs of high block given to  the patient including shortness of breath, tingling/numbness in hands, complete motor block, or any concerning symptoms with instructions to call for help. Patient was given instructions on fall risk and not to get out of bed. All questions and concerns addressed with instructions to call with any issues or inadequate analgesia.   Patient tolerated the insertion well without immediate complications.Reason for block:procedure for pain

## 2017-10-09 NOTE — Interval H&P Note (Signed)
History and Physical Interval Note: I have discussed the need for CD with the patient.  All questions were answered.  10/09/2017 11:37 PM  Grayce SessionsKenyata Nicotra  has presented today for surgery, with the diagnosis of failure to tolerate labor  The various methods of treatment have been discussed with the patient and family. After consideration of risks, benefits and other options for treatment, the patient has consented to  Procedure(s): CESAREAN SECTION (N/A) as a surgical intervention .  The patient's history has been reviewed, patient examined, no change in status, stable for surgery.  I have reviewed the patient's chart and labs.  Questions were answered to the patient's satisfaction.     Brennan Baileyavid Lonnie Reth

## 2017-10-09 NOTE — Anesthesia Preprocedure Evaluation (Signed)
Anesthesia Evaluation  Patient identified by MRN, date of birth, ID band Patient awake    Reviewed: Allergy & Precautions, H&P , NPO status , Patient's Chart, lab work & pertinent test results  History of Anesthesia Complications Negative for: history of anesthetic complications  Airway Mallampati: III  TM Distance: >3 FB Neck ROM: full    Dental  (+) Teeth Intact   Pulmonary neg pulmonary ROS,           Cardiovascular Exercise Tolerance: Good negative cardio ROS       Neuro/Psych negative neurological ROS  negative psych ROS   GI/Hepatic Neg liver ROS, GERD  ,  Endo/Other  negative endocrine ROS  Renal/GU negative Renal ROS  negative genitourinary   Musculoskeletal   Abdominal   Peds  Hematology negative hematology ROS (+)   Anesthesia Other Findings Past Medical History: No date: Medical history non-contributory   Reproductive/Obstetrics (+) Pregnancy                             Anesthesia Physical Anesthesia Plan  ASA: II  Anesthesia Plan: General and Epidural   Post-op Pain Management:    Induction:   PONV Risk Score and Plan:   Airway Management Planned:   Additional Equipment:   Intra-op Plan:   Post-operative Plan:   Informed Consent: I have reviewed the patients History and Physical, chart, labs and discussed the procedure including the risks, benefits and alternatives for the proposed anesthesia with the patient or authorized representative who has indicated his/her understanding and acceptance.   Dental Advisory Given  Plan Discussed with: Anesthesiologist, CRNA and Surgeon  Anesthesia Plan Comments:         Anesthesia Quick Evaluation

## 2017-10-09 NOTE — Telephone Encounter (Signed)
Called to inform [facility] Generations[facility]yata of induction date and time. No answer. Message left.   Doreene BurkeAnnie Talley Kreiser, CNM

## 2017-10-09 NOTE — OB Triage Note (Signed)
Pt is a 26y/o G1P0 678w6d that presents from the ED c/o ctx every 15 minutes that started around 0300 this am. Pt denies VB, Denies LOF and states positive FM.

## 2017-10-09 NOTE — Op Note (Signed)
      OP NOTE  Date: 10/09/2017   11:40 PM Name Grayce SessionsKenyata Oelkers MR# [id number]  Preoperative Diagnosis: 1. Intrauterine pregnancy at 6855w6d Active Problems:   Indication for care in labor and delivery, antepartum   Indication for care in labor or delivery  2.  failure to progress: arrest of descent, failure to progress: arrest of dilation and non-reassuring fetal status with multiple bradycardic episodes.  Postoperative Diagnosis: 1. Intrauterine pregnancy at 8255w6d, delivered 2. Viable infant 3. Remainder same as pre-op 4. OP 5. Nuchal X1   Procedure: 1. Primary Low-Transverse Cesarean Section  Surgeon: Elonda Huskyavid J. Amritpal Shropshire, MD  Assistant:  Jeralyn BennettLawhorn, CNM  Anesthesia: Epidural   EBL: 600  ml    Findings: 1) female infant, Apgar scores of 9    at 1 minute and 9    at 5 minutes and a birthweight of 111.82  ounces.    2) Normal uterus, tubes and ovaries.   Procedure:  The patient was prepped and draped in the supine position and placed under spinal anesthesia.  A transverse incision was made across the abdomen in a Pfannenstiel manner. If indicated the old scar was systematically removed with sharp dissection.  We carried the dissection down to the level of the fascia.  The fascia was incised in a curvilinear manner.  The fascia was then elevated from the rectus muscles with blunt and sharp dissection.  The rectus muscles were separated laterally exposing the peritoneum.  The peritoneum was carefully entered with care being taken to avoid bowel and bladder.  A self-retaining retractor was placed.  The visceral peritoneum was incised in a curvilinear fashion across the lower uterine segment creating a bladder flap. A transverse incision was made across the lower uterine segment and extended laterally and superiorly using the bandage scissors.  Artificial rupture membranes was performed and Clear fluid was noted.  The infant was delivered from the cephalic, OP position.  A nuchal cord was  present. The cord was doubly clamped and cut. Cord blood was obtained if appropriate.  The infant was handed to the pediatric personnel  who then placed the infant under heat lamps where it was cleaned dried and re-suctioned. The placenta was delivered. The hysterotomy incision was then identified on ring forceps.  The uterine cavity was cleaned with a moist lap sponge.  The hysterotomy incision was closed with a running interlocking suture of Vicryl.  Hemostasis was excellent.  Pitocin was run in the IV and the uterus was found to be firm. The posterior cul-de-sac and gutters were cleaned and inspected.  Hemostasis was noted.  The fascia was then closed with a running suture of #1 Vicryl.  Hemostasis of the subcutaneous tissues was obtained using the Bovie.  The subcutaneous tissues were closed with a running suture of 000 Vicryl.  A subcuticular suture was placed.  Steri-Strips were applied in the usual manner.  A pressure dressing was placed.  The patient went to the recovery room in stable condition.   Elonda Huskyavid J. Chayim Bialas, M.D. 10/09/2017 11:40 PM

## 2017-10-09 NOTE — H&P (Signed)
Obstetric History and Physical  Julia Rollins is a 26 y.o. G1P0 with IUP at [redacted]w[redacted]d presenting with uterine contractions. Patient states she has been having none vaginal bleeding, clear fluid membranes, with active fetal movement.    Denies difficulty breathing or respiratory distress, chest pain, and leg pain or swelling.   Prenatal Course  Source of Care: [facility]-initial visit: 13 wks, total visits: 12  Pregnancy complications or risks: history of positive drug screen  Prenatal labs and studies:  ABO, Rh: O/Positive/-- 2023-03-24 1104)  Antibody: Negative 2023/03/24 1104)  Rubella: 4.20 2023/03/24 1104)  Varicella: 1258 24-Mar-2023 1104)  RPR: Non Reactive 03/24/2023 1104)   HBsAg: Negative 03-24-2023 1104)   HIV: Non Reactive 03/24/2023 1104)  ZOX:[id number] (10/05 1120)  1 hr Glucola: 85 (09/13 1456)  Genetic screening: Declined  Anatomy US: Normal (07/02 1019)  Past Medical History:  Diagnosis Date  . Medical history non-contributory     Past Surgical History:  Procedure Laterality Date  . HERNIA REPAIR      OB History  Gravida Para Term Preterm AB Living  1            SAB TAB Ectopic Multiple Live Births               # Outcome Date GA Lbr Len/2nd Weight Sex Delivery Anes PTL Lv  1 Current               Social History   Social History  . Marital status: Single    Spouse name: N/A  . Number of children: N/A  . Years of education: N/A   Social History Main Topics  . Smoking status: Never Smoker  . Smokeless tobacco: Never Used  . Alcohol use No  . Drug use: Yes     Comment: Marijuana  . Sexual activity: Yes    Birth control/ protection: None   Other Topics Concern  . None   Social History Narrative  . None    No family history on file.  No prescriptions prior to admission.    No Known Allergies  Review of Systems: Negative except for what is mentioned in HPI.  Physical Exam:  Temp:  [97.7 F (36.5 C)] 97.7 F (36.5 C) (10/26 0946) Pulse Rate:  [80] 80  (10/26 0946) Resp:  [18] 18 (10/26 0946) BP: (127)/(73) 127/73 (10/26 0946) Weight:  [236 lb (107 kg)] 236 lb (107 kg) (10/26 0946)  GENERAL: Well-developed, well-nourished female in no acute distress.   LUNGS: Clear to auscultation bilaterally.   HEART: Regular rate and rhythm.  ABDOMEN: Soft, nontender, nondistended, gravid.  EXTREMITIES: Nontender, no edema, 2+ distal pulses.  Cervical Exam: Dilation: 3.5 Effacement (%): 60 Cervical Position: Posterior Station: -2 Presentation: Vertex Exam by:: Shaune Leeks CNM  FHT:  Baseline rate 125-130 bpm   Variability moderate  Accelerations present   Decelerations variable-non recurrent, resolve with position change  Contractions: Occasional, soft resting tone   Pertinent Labs/Studies:   Results for orders placed or performed during the hospital encounter of 10/09/17 (from the past 24 hour(s))  ROM Plus ([facility] only)     Status: None   Collection Time: 10/09/17 12:42 PM  Result Value Ref Range   Rom Plus POSITIVE     Assessment :  Julia Rollins is a 26 y.o. G1P0 at [redacted]w[redacted]d being admitted for labor, Rh positive, GBS negative  FHR Category II  Plan:  Labor: Expectant management.  Induction/Augmentation as needed, per protocol  FWB: Reassuring fetal heart tracing.  GBS negative  Delivery plan: Hopeful for vaginal delivery   Gunnar BullaJenkins Michelle Lawhorn, CNM [facility], [facility]

## 2017-10-09 NOTE — Anesthesia Post-op Follow-up Note (Signed)
Anesthesia QCDR form completed.        

## 2017-10-09 NOTE — Anesthesia Procedure Notes (Signed)
Date/Time: 10/09/2017 9:50 PM Performed by: Stormy FabianURTIS, Kemoni Quesenberry Pre-anesthesia Checklist: Patient identified, Emergency Drugs available, Suction available and Patient being monitored Patient Re-evaluated:Patient Re-evaluated prior to induction Oxygen Delivery Method: Nasal cannula Induction Type: IV induction Dental Injury: Teeth and Oropharynx as per pre-operative assessment  Comments: Nasal cannula with etCO2 monitoring

## 2017-10-09 NOTE — Progress Notes (Signed)
Grayce SessionsKenyata Vold is a 26 y.o. G1P0 at 2847w6d by LMP admitted for rupture of membranes  Subjective:  Pt resting quietly in bed, reports relief of pain since epidural placement. Questions regarding fetal heart rate tracing. FOB and other family members at bedside.   Denies difficulty breathing or respiratory distress, chest pain, abdominal pain, vaginal bleeding, and leg pain or swelling.   Objective:  Temp:  [97.7 F (36.5 C)] 97.7 F (36.5 C) (10/26 0946) Pulse Rate:  [80-130] 122 (10/26 1849) Resp:  [18] 18 (10/26 0946) BP: (103-135)/(59-90) 111/59 (10/26 1849) SpO2:  [100 %] 100 % (10/26 1818) Weight:  [236 lb (107 kg)] 236 lb (107 kg) (10/26 0946)   FHT:  FHR: 135 bpm, variability: moderate,  accelerations:  Present,  decelerations:  Present variable, prolong  UC:   regular, every two (2) to seven (7) minutes, soft resting tone.   SVE:   Dilation: 6 Effacement (%): 90 Station: -1 Exam by:: Willodean RosenthalM. Charis Juliana, CNM  Labs:  Lab Results  Component Value Date   WBC 7.1 10/09/2017   HGB 12.2 10/09/2017   HCT 36.5 10/09/2017   MCV 81.8 10/09/2017   PLT 202 10/09/2017    Assessment:  Grayce SessionsKenyata Gazda is a 26 y.o. G1P0 at 4747w6d admitted for labor, Rh positive, GBS negative  FHR Category II  Plan:  Fetal heart rate tracing and plan of care reviewed with patient and family members.  FSE and IUPC placed without difficulty. Will start amnioinfusion for recurrent variable decelerations, see orders.   If fetal heart rate, reassuring or Category I will start pitocin for augmentation of labor.   Reviewed red flag symptoms and when to call.   Dr. Logan BoresEvans notified and aware of plan of care.    Gunnar BullaJenkins Michelle Shaquinta Peruski, CNM 10/09/2017, 8:26 PM

## 2017-10-10 LAB — CBC
HCT: 29.4 % — ABNORMAL LOW (ref 35.0–47.0)
HEMOGLOBIN: 9.6 g/dL — AB (ref 12.0–16.0)
MCH: 26.7 pg (ref 26.0–34.0)
MCHC: 32.6 g/dL (ref 32.0–36.0)
MCV: 81.9 fL (ref 80.0–100.0)
Platelets: 162 10*3/uL (ref 150–440)
RBC: 3.59 MIL/uL — ABNORMAL LOW (ref 3.80–5.20)
RDW: 15.1 % — ABNORMAL HIGH (ref 11.5–14.5)
WBC: 9 10*3/uL (ref 3.6–11.0)

## 2017-10-10 LAB — RPR: RPR: NONREACTIVE

## 2017-10-10 MED ORDER — PRENATAL MULTIVITAMIN CH
1.0000 | ORAL_TABLET | Freq: Every day | ORAL | Status: DC
  Administered 2017-10-10 – 2017-10-11 (×2): 1 via ORAL
  Filled 2017-10-10 (×2): qty 1

## 2017-10-10 MED ORDER — DIPHENHYDRAMINE HCL 25 MG PO CAPS: 25.0000 mg | ORAL_CAPSULE | ORAL | Status: DC | PRN

## 2017-10-10 MED ORDER — SODIUM CHLORIDE 0.9% FLUSH: 3.0000 mL | INTRAVENOUS | Status: DC | PRN

## 2017-10-10 MED ORDER — OXYTOCIN 40 UNITS IN LACTATED RINGERS INFUSION - SIMPLE MED
INTRAVENOUS | Status: AC
  Filled 2017-10-10: qty 1000

## 2017-10-10 MED ORDER — FERROUS SULFATE 325 (65 FE) MG PO TABS
325.0000 mg | ORAL_TABLET | Freq: Three times a day (TID) | ORAL | Status: DC
  Administered 2017-10-10 – 2017-10-11 (×3): 325 mg via ORAL
  Filled 2017-10-10 (×3): qty 1

## 2017-10-10 MED ORDER — OXYCODONE HCL 5 MG PO TABS: 5.0000 mg | ORAL_TABLET | Freq: Four times a day (QID) | ORAL | Status: DC | PRN

## 2017-10-10 MED ORDER — OXYCODONE-ACETAMINOPHEN 5-325 MG PO TABS
1.0000 | ORAL_TABLET | ORAL | Status: DC | PRN
  Administered 2017-10-10 – 2017-10-11 (×4): 1 via ORAL
  Filled 2017-10-10 (×3): qty 1

## 2017-10-10 MED ORDER — ZOLPIDEM TARTRATE 5 MG PO TABS: 5.0000 mg | ORAL_TABLET | Freq: Every evening | ORAL | Status: DC | PRN

## 2017-10-10 MED ORDER — LACTATED RINGERS IV SOLN: INTRAVENOUS | Status: DC

## 2017-10-10 MED ORDER — MENTHOL 3 MG MT LOZG
1.0000 | LOZENGE | OROMUCOSAL | Status: DC | PRN
  Filled 2017-10-10: qty 9

## 2017-10-10 MED ORDER — SENNOSIDES-DOCUSATE SODIUM 8.6-50 MG PO TABS
2.0000 | ORAL_TABLET | ORAL | Status: DC
  Administered 2017-10-10 – 2017-10-11 (×2): 2 via ORAL
  Filled 2017-10-10 (×3): qty 2

## 2017-10-10 MED ORDER — DIPHENHYDRAMINE HCL 50 MG/ML IJ SOLN: 12.5000 mg | INTRAMUSCULAR | Status: DC | PRN

## 2017-10-10 MED ORDER — NALBUPHINE HCL 10 MG/ML IJ SOLN: 5.0000 mg | Freq: Once | INTRAMUSCULAR | Status: DC | PRN

## 2017-10-10 MED ORDER — OXYCODONE-ACETAMINOPHEN 5-325 MG PO TABS: 2.0000 | ORAL_TABLET | ORAL | Status: DC | PRN

## 2017-10-10 MED ORDER — ACETAMINOPHEN 325 MG PO TABS: 650.0000 mg | ORAL_TABLET | ORAL | Status: DC | PRN

## 2017-10-10 MED ORDER — OXYCODONE-ACETAMINOPHEN 5-325 MG PO TABS
ORAL_TABLET | ORAL | Status: AC
  Filled 2017-10-10: qty 1

## 2017-10-10 MED ORDER — IBUPROFEN 600 MG PO TABS
ORAL_TABLET | ORAL | Status: AC
  Filled 2017-10-10: qty 1

## 2017-10-10 MED ORDER — SIMETHICONE 80 MG PO CHEW
80.0000 mg | CHEWABLE_TABLET | Freq: Four times a day (QID) | ORAL | Status: DC
  Administered 2017-10-10 – 2017-10-11 (×6): 80 mg via ORAL
  Filled 2017-10-10 (×6): qty 1

## 2017-10-10 MED ORDER — IBUPROFEN 600 MG PO TABS
600.0000 mg | ORAL_TABLET | Freq: Four times a day (QID) | ORAL | Status: DC
  Administered 2017-10-10 – 2017-10-11 (×7): 600 mg via ORAL
  Filled 2017-10-10 (×6): qty 1

## 2017-10-10 MED ORDER — NALOXONE HCL 2 MG/2ML IJ SOSY
1.0000 ug/kg/h | PREFILLED_SYRINGE | INTRAVENOUS | Status: DC | PRN
  Filled 2017-10-10: qty 2

## 2017-10-10 MED ORDER — NALOXONE HCL 0.4 MG/ML IJ SOLN: 0.4000 mg | INTRAMUSCULAR | Status: DC | PRN

## 2017-10-10 MED ORDER — NALBUPHINE HCL 10 MG/ML IJ SOLN: 5.0000 mg | INTRAMUSCULAR | Status: DC | PRN

## 2017-10-10 MED ORDER — DIPHENHYDRAMINE HCL 25 MG PO CAPS: 25.0000 mg | ORAL_CAPSULE | Freq: Four times a day (QID) | ORAL | Status: DC | PRN

## 2017-10-10 MED ORDER — ONDANSETRON HCL 4 MG/2ML IJ SOLN: 4.0000 mg | Freq: Three times a day (TID) | INTRAMUSCULAR | Status: DC | PRN

## 2017-10-10 MED ORDER — OXYTOCIN 40 UNITS IN LACTATED RINGERS INFUSION - SIMPLE MED
2.5000 [IU]/h | INTRAVENOUS | Status: AC
  Filled 2017-10-10: qty 1000

## 2017-10-10 NOTE — Anesthesia Post-op Follow-up Note (Signed)
  Anesthesia Pain Follow-up Note  Patient: Grayce SessionsKenyata Morikawa  Day #: 1  Date of Follow-up: 10/10/2017 Time: 8:30 AM  Last Vitals:  Vitals:   10/10/17 0100 10/10/17 0427  BP: 108/69 113/64  Pulse: (!) 115 (!) 103  Resp:  20  Temp:  36.7 C  SpO2: 98% 100%    Level of Consciousness: alert  Pain: mild   Side Effects:None  Catheter Site Exam:clean, dry     Plan: D/C from anesthesia care at surgeon's request  Cleda MccreedyJoseph K Renardo Cheatum

## 2017-10-10 NOTE — Progress Notes (Signed)
Post Partum Day 1  Subjective:  Pt is doing well. Eating, drinking, and ambulating without difficulty.   Objective:  Temp:  [97.7 F (36.5 C)-98.4 F (36.9 C)] 98.3 F (36.8 C) (10/27 1226) Pulse Rate:  [86-130] 90 (10/27 1226) Resp:  [14-27] 18 (10/27 1226) BP: (90-135)/(55-90) 105/62 (10/27 1226) SpO2:  [98 %-100 %] 100 % (10/27 0840)  Physical Exam:   General: alert and cooperative  Lochia: appropriate  Uterine Fundus: firm  Incision: dressing intact  DVT Evaluation: No evidence of DVT seen on physical exam. Negative Homan's sign.   Recent Labs  10/09/17 1440  HGB 12.2  HCT 36.5    Assessment:  Status post primary cesarean section for fetal intolerance to labor, day 1  Breastfeeding  Rh positive  Plan:  Will collect CBC, then discontinue IV, if hemoglobin greater than 8.0.   Continue orders as written. Reassess as needed.   Anticipate discharge tomorrow.     LOS: 1 day   Gunnar BullaJenkins Michelle Torrian Canion, CNM 10/10/2017, 2:22 PM

## 2017-10-10 NOTE — Anesthesia Postprocedure Evaluation (Signed)
Anesthesia Post Note  Patient: Civil engineer, contractingKenyata Rollins  Procedure(s) Performed: CESAREAN SECTION (N/A Abdomen)  Patient location during evaluation: Mother Baby Anesthesia Type: Epidural Level of consciousness: awake and alert Pain management: pain level controlled Vital Signs Assessment: post-procedure vital signs reviewed and stable Respiratory status: spontaneous breathing, nonlabored ventilation and respiratory function stable Cardiovascular status: stable Postop Assessment: no headache, no backache and patient able to bend at knees (able to ambulate) Anesthetic complications: no     Last Vitals:  Vitals:   10/10/17 0100 10/10/17 0427  BP: 108/69 113/64  Pulse: (!) 115 (!) 103  Resp:  20  Temp:  36.7 C  SpO2: 98% 100%    Last Pain:  Vitals:   10/10/17 0745  TempSrc:   PainSc: 3                  Cleda MccreedyJoseph K Piscitello

## 2017-10-10 NOTE — Progress Notes (Signed)
Patient ID: Julia Rollins Neuwirth, female   DOB: 01/26/1991, 26 y.o.   MRN: [id number]    Progress Note - Cesarean Delivery  Julia Rollins Marxen is a 26 y.o. G1P0 now PP day 1 s/p C-Section, Low Transverse .   Subjective:  Patient reports no problems with eating, bowel movements, voiding, or their wound  Objective:  Vital signs in last 24 hours: Temp:  [97.7 F (36.5 C)-98.4 F (36.9 C)] 97.7 F (36.5 C) (10/27 0840) Pulse Rate:  [80-130] 86 (10/27 0840) Resp:  [14-27] 18 (10/27 0840) BP: (90-135)/(55-90) 90/74 (10/27 0840) SpO2:  [98 %-100 %] 100 % (10/27 0840) Weight:  [236 lb (107 kg)] 236 lb (107 kg) (10/26 0946)  Physical Exam:  General: alert, cooperative and no distress Lochia: appropriate Uterine Fundus: firm Incision: Dressing intact DVT Evaluation: No evidence of DVT seen on physical exam.    Data Review  Recent Labs  10/09/17 1440  HGB 12.2  HCT 36.5    Assessment:  Active Problems:   Indication for care in labor and delivery, antepartum   Indication for care in labor or delivery   Status post Cesarean section. Doing well postoperatively.     Plan:       Continue current care.    Elonda Huskyavid J. Shawn Dannenberg, M.D. 10/10/2017 9:28 AM

## 2017-10-11 MED ORDER — OXYCODONE-ACETAMINOPHEN 5-325 MG PO TABS: 1.0000 | ORAL_TABLET | ORAL | 0 refills | Status: DC | PRN

## 2017-10-11 MED ORDER — FERROUS SULFATE 325 (65 FE) MG PO TABS: 325.0000 mg | ORAL_TABLET | Freq: Three times a day (TID) | ORAL | 3 refills | Status: DC

## 2017-10-11 MED ORDER — SIMETHICONE 80 MG PO CHEW: 80.0000 mg | CHEWABLE_TABLET | Freq: Four times a day (QID) | ORAL | 0 refills | Status: DC

## 2017-10-11 MED ORDER — IBUPROFEN 600 MG PO TABS: 600.0000 mg | ORAL_TABLET | Freq: Four times a day (QID) | ORAL | 0 refills | Status: DC

## 2017-10-11 NOTE — Progress Notes (Signed)
Patient discharged home with infant and significant other. Discharge instructions, prescriptions, hygiene kit and follow up appointment given to and reviewed with patient and significant other. Patient verbalized understanding. Escorted out via wheelchair by nursing staff.  

## 2017-10-11 NOTE — Discharge Summary (Signed)
Obstetric Discharge Summary  Patient ID: Julia Rollins MRN: [id number] DOB/AGE: 25/26/1992 25 y.o.   Date of Admission: 10/09/2017 Serafina Royals, CNM Charlena Sanguinetti, MD)  Date of Discharge: 10/11/2017 Serafina Royals, CNM Charlena Hazard, MD)  Admitting Diagnosis: Premature rupture of membrane at [redacted]w[redacted]d  Secondary Diagnosis: None  Mode of Delivery: primary cesarean section     Discharge Diagnosis: Reasons for cesarean section  Non-reassuring FHR   Intrapartum Procedures: epidural, pitocin augmentation, placement of fetal scalp electrode and placement of intrauterine catheter   Post partum procedures: None  Complications: none   Brief Hospital Course    Julia Rollins is a G1P0 who underwent cesarean section on 10/09/2017 - 10/10/2017.  Patient had an uncomplicated surgery; for further details of this surgery, please refer to the operative note.  Patient had an uncomplicated postpartum course.  By time of discharge on POD#2, her pain was controlled on oral pain medications; she had appropriate lochia and was ambulating, voiding without difficulty, tolerating regular diet and passing flatus.   She was deemed stable for discharge to home.    Labs: CBC Latest Ref Rng & Units 10/10/2017 10/09/2017 08/27/2017  WBC 3.6 - 11.0 K/uL 9.0 7.1 6.3  Hemoglobin 12.0 - 16.0 g/dL 0.1(U) 27.2 53.6  Hematocrit 35.0 - 47.0 % 29.4(L) 36.5 35.2  Platelets 150 - 440 K/uL 162 202 194   O POS  Physical exam:   BP 117/73 (BP Location: Left Arm)   Pulse 87   Temp 98 F (36.7 C) (Oral)   Resp 18   Ht 5\' 6"  (1.676 m)   Wt 236 lb (107 kg)   LMP 12/27/2016   SpO2 100%   Breastfeeding? Unknown   BMI 38.09 kg/m   General: alert and no distress  CVS exam: normal rate, regular rhythm, normal S1, S2, no murmurs, rubs, clicks or gallops.  Lungs - Normal respiratory effort, chest expands symmetrically. Lungs are clear to auscultation, no crackles or wheezes.  Lochia: appropriate  Abdomen: soft,  NT  Uterine Fundus: firm  Incision: healing well, no significant drainage, no dehiscence, no significant erythema  Extremities: No evidence of DVT seen on physical exam. No lower extremity edema.  Discharge Instructions: Per After Visit Summary.  Activity: Advance as tolerated. Pelvic rest for 6 weeks.  Also refer to After Visit Summary  Diet: Regular Medications:  Allergies as of 10/11/2017   No Known Allergies     Medication List    TAKE these medications   ferrous sulfate 325 (65 FE) MG tablet Take 1 tablet (325 mg total) by mouth 3 (three) times daily with meals.   ibuprofen 600 MG tablet Commonly known as:  ADVIL,MOTRIN Take 1 tablet (600 mg total) by mouth every 6 (six) hours.   oxyCODONE-acetaminophen 5-325 MG tablet Commonly known as:  PERCOCET/ROXICET Take 1 tablet by mouth every 4 (four) hours as needed (pain scale 4-7).   simethicone 80 MG chewable tablet Commonly known as:  MYLICON Chew 1 tablet (80 mg total) by mouth 4 (four) times daily.      Outpatient follow up:  Follow-up Information    Gunnar Bulla, CNM. Schedule an appointment as soon as possible for a visit.   Specialties:  Certified Nurse Midwife, Obstetrics and Gynecology, Radiology Why:  Please call office to schedule one (1) week incision check Contact information: [address] [place] [address] [phone]          Postpartum contraception: no method  Discharged Condition: stable  Discharged  to: home   Newborn Data:  Disposition:home with mother  Apgars: APGAR (1 MIN): 9   APGAR (5 MINS): 9    Baby Feeding: Breast   Gunnar BullaJenkins Michelle Kowen Kluth, CNM

## 2017-10-12 ENCOUNTER — Encounter: Payer: Medicaid Other | Admitting: Certified Nurse Midwife

## 2017-10-12 ENCOUNTER — Encounter: Payer: Self-pay | Admitting: Obstetrics and Gynecology

## 2017-10-12 ENCOUNTER — Other Ambulatory Visit: Payer: Medicaid Other

## 2017-10-16 ENCOUNTER — Ambulatory Visit (INDEPENDENT_AMBULATORY_CARE_PROVIDER_SITE_OTHER): Payer: Medicaid Other | Admitting: Certified Nurse Midwife

## 2017-10-16 VITALS — BP 114/74 | HR 72 | Wt 216.6 lb

## 2017-10-16 DIAGNOSIS — Z5189 Encounter for other specified aftercare: Secondary | ICD-10-CM

## 2017-10-16 DIAGNOSIS — Z98891 History of uterine scar from previous surgery: Secondary | ICD-10-CM

## 2017-10-16 NOTE — Progress Notes (Signed)
    OBSTETRICS/GYNECOLOGY POST-OPERATIVE CLINIC VISIT  Subjective:     Julia SessionsKenyata Rollins is a 26 y.o. female who presents to the clinic 1 week status post primary cesarean section for fetal intolerance to labor. Eating a regular diet without difficulty. Bowel movements are normal. Pain is controlled with current analgesics. Medications being used: ibuprofen (OTC) and narcotic analgesics including hydrocodone/acetaminophen (Lorcet, Lortab, Norco, Vicodin).  The following portions of the patient's history were reviewed and updated as appropriate: allergies, current medications, past family history, past medical history, past social history, past surgical history and problem list.  Review of Systems Pertinent items are noted in HPI.    Objective:    BP 114/74   Pulse 72   Wt 216 lb 9 oz (98.2 kg)   Breastfeeding? Yes   BMI 34.95 kg/m    General:  alert and no distress  Abdomen: soft, bowel sounds active, non-tender  Incision:   healing well, no drainage, no erythema, no hernia, no seroma, no swelling, no dehiscence, incision well approximated   Assessment:    Doing well postoperatively.   Plan:   1. Continue any current medications. 2. Wound care discussed. 3. Education regarding postpartum contraception. Information given for paragard and caya.  4. Activity restrictions: no lifting more than 25 pounds and driving while taking narcotics 5. Anticipated return to work: not applicable. 6. Follow up: 5 weeks for postpartum visit or sooner if needed.    Gunnar BullaJenkins Michelle Jesiel Garate, CNM [facility]

## 2017-10-16 NOTE — Patient Instructions (Signed)

## 2017-10-16 NOTE — Progress Notes (Signed)
Pt is here for an incision check. 

## 2017-10-17 DIAGNOSIS — Z98891 History of uterine scar from previous surgery: Secondary | ICD-10-CM

## 2017-10-17 HISTORY — DX: History of uterine scar from previous surgery: Z98.891

## 2017-11-16 ENCOUNTER — Encounter: Payer: Medicaid Other | Admitting: Certified Nurse Midwife

## 2017-11-20 ENCOUNTER — Encounter: Payer: Medicaid Other | Admitting: Certified Nurse Midwife

## 2017-11-27 ENCOUNTER — Telehealth: Payer: Self-pay | Admitting: *Deleted

## 2017-11-27 NOTE — Telephone Encounter (Signed)
error 

## 2017-12-01 ENCOUNTER — Encounter: Payer: Medicaid Other | Admitting: Certified Nurse Midwife

## 2017-12-02 ENCOUNTER — Encounter: Payer: Medicaid Other | Admitting: Obstetrics and Gynecology

## 2018-09-07 ENCOUNTER — Encounter: Payer: Medicaid Other | Admitting: Certified Nurse Midwife

## 2018-12-15 HISTORY — DX: Maternal care for unspecified type scar from previous cesarean delivery: O34.219

## 2018-12-15 NOTE — L&D Delivery Note (Signed)
OB/GYN Faculty Practice Delivery Note  Julia Rollins is a 28 y.o. G2P1001 s/p NSVD at [redacted]w[redacted]d. She was admitted for Active labor and SROM.   ROM: 14h 85m with clear fluid GBS Status: Negative Maximum Maternal Temperature: 98.5  Labor Progress: . Patient admitted in active labor after a prolonged prodromal labor. She had SROM of clear fluid and then AROM of a forebag. Patient made slow progress once in active labor. IUPC was placed and patient was found to have adequate contractions with pitocin. She eventually became C/C with repetitive decelerations. Dr. Roselie Awkward consulted and reviewed tracing. Patient was fully dilated at that time, and pushing started.   Delivery Date/Time: 12/15/2019 at 1740 Delivery: Called to room and patient was complete and pushing. Head delivered LOA. No nuchal cord present. Shoulder and body delivered in usual fashion. Infant with spontaneous cry, placed on mother's abdomen, dried and stimulated. Cord clamped x 2 after 1-minute delay, and cut by CNM. Cord blood drawn. Placenta delivered spontaneously with gentle cord traction. Fundus firm with massage and Pitocin. Labia, perineum, vagina, and cervix inspected inspected with 2nd vaginal sidewall laceration.   Patient desired IUD placement. Attempted to place IUD, and patient's LUS was full of clots. Clots removed and attempted to massage fundus. Patient was very uncomfortable with this procedure. Cytotec placed. Patient then decided she did not want IUD placed at this time.    Placenta: spontaneous/complete/intact Complications: none Lacerations: 2nd degree EBL: 444cc Analgesia: epidural  Postpartum Planning [x ] message to sent to schedule follow-up  [ x] vaccines UTD  Infant: female  APGARs 6/9  [place], CNM  12/15/19  6:13 PM

## 2019-06-03 ENCOUNTER — Encounter: Payer: Self-pay | Admitting: General Practice

## 2019-06-06 ENCOUNTER — Telehealth: Payer: Self-pay | Admitting: General Practice

## 2019-06-14 ENCOUNTER — Ambulatory Visit (INDEPENDENT_AMBULATORY_CARE_PROVIDER_SITE_OTHER): Payer: Medicaid Other | Admitting: *Deleted

## 2019-06-14 ENCOUNTER — Other Ambulatory Visit: Payer: Self-pay

## 2019-06-14 DIAGNOSIS — Z3480 Encounter for supervision of other normal pregnancy, unspecified trimester: Secondary | ICD-10-CM | POA: Insufficient documentation

## 2019-06-14 HISTORY — DX: Encounter for supervision of other normal pregnancy, unspecified trimester: Z34.80

## 2019-06-14 MED ORDER — VITAFOL GUMMIES 3.33-0.333-34.8 MG PO CHEW: 3.0000 | CHEWABLE_TABLET | Freq: Every day | ORAL | 12 refills | Status: DC

## 2019-06-14 NOTE — Progress Notes (Signed)
   Virtual Visit via Telephone Note  I connected with [name] on 06/14/19 at  1:30 PM EDT by telephone and verified that I am speaking with the correct person using two identifiers.  Location: Patient: Julia Rollins MRN [id number] Provider: Derl Barrow, RN   I discussed the limitations, risks, security and privacy concerns of performing an evaluation and management service by telephone and the availability of in person appointments. I also discussed with the patient that there may be a patient responsible charge related to this service. The patient expressed understanding and agreed to proceed.   History of Present Illness: PRENATAL INTAKE SUMMARY  Ms. Losey presents today New OB Nurse Interview.  OB History    Gravida  2   Para  1   Term  1   Preterm      AB      Living  1     SAB      TAB      Ectopic      Multiple      Live Births  1          I have reviewed the patient's medical, obstetrical, social, and family histories, medications, and available lab results.  SUBJECTIVE She has no unusual complaints   Observations/Objective: Initial nurse interview for history/labs (New OB)  EDD: 12/11/2019 by LMP GA: [redacted]w[redacted]d G2P1001 FHT: non face to face visit  GENERAL APPEARANCE: oriented to person, place and time. Non face to face visit.  Assessment and Plan: Normal pregnancy Prenatal care-[facility] Prenatal gummies Rx sent to pharmacy Ultrasound 14+ ordered  Follow Up Instructions:   I discussed the assessment and treatment plan with the patient. The patient was provided an opportunity to ask questions and all were answered. The patient agreed with the plan and demonstrated an understanding of the instructions.   The patient was advised to call back or seek an in-person evaluation if the symptoms worsen or if the condition fails to improve as anticipated.  I provided 20 minutes of non-face-to-face time during this encounter.   Derl Barrow, RN

## 2019-06-24 ENCOUNTER — Encounter: Payer: Self-pay | Admitting: General Practice

## 2019-06-24 ENCOUNTER — Other Ambulatory Visit: Payer: Self-pay

## 2019-06-24 ENCOUNTER — Encounter: Payer: Self-pay | Admitting: Family

## 2019-06-24 ENCOUNTER — Other Ambulatory Visit (HOSPITAL_COMMUNITY)
Admission: RE | Admit: 2019-06-24 | Discharge: 2019-06-24 | Disposition: A | Discharge status: Home / Self Care | Payer: Medicaid Other | Source: Ambulatory Visit | Attending: Family | Admitting: Family

## 2019-06-24 ENCOUNTER — Ambulatory Visit (INDEPENDENT_AMBULATORY_CARE_PROVIDER_SITE_OTHER): Payer: Medicaid Other | Admitting: Family

## 2019-06-24 VITALS — BP 119/74 | HR 92 | Temp 98.2°F | Wt 198.2 lb

## 2019-06-24 DIAGNOSIS — Z3402 Encounter for supervision of normal first pregnancy, second trimester: Secondary | ICD-10-CM

## 2019-06-24 DIAGNOSIS — Z3A15 15 weeks gestation of pregnancy: Secondary | ICD-10-CM

## 2019-06-24 DIAGNOSIS — O2340 Unspecified infection of urinary tract in pregnancy, unspecified trimester: Secondary | ICD-10-CM

## 2019-06-24 DIAGNOSIS — O34219 Maternal care for unspecified type scar from previous cesarean delivery: Secondary | ICD-10-CM | POA: Diagnosis not present

## 2019-06-24 DIAGNOSIS — B9689 Other specified bacterial agents as the cause of diseases classified elsewhere: Secondary | ICD-10-CM | POA: Diagnosis not present

## 2019-06-24 DIAGNOSIS — Z3482 Encounter for supervision of other normal pregnancy, second trimester: Secondary | ICD-10-CM

## 2019-06-24 DIAGNOSIS — Z3480 Encounter for supervision of other normal pregnancy, unspecified trimester: Secondary | ICD-10-CM | POA: Diagnosis not present

## 2019-06-24 DIAGNOSIS — N760 Acute vaginitis: Secondary | ICD-10-CM | POA: Diagnosis not present

## 2019-06-24 DIAGNOSIS — O2342 Unspecified infection of urinary tract in pregnancy, second trimester: Secondary | ICD-10-CM

## 2019-06-24 MED ORDER — METRONIDAZOLE 500 MG PO TABS: 500.0000 mg | ORAL_TABLET | Freq: Two times a day (BID) | ORAL | 0 refills | Status: DC

## 2019-06-24 MED ORDER — BLOOD PRESSURE KIT: 1.0000 | PACK | Freq: Every day | 0 refills | Status: DC

## 2019-06-24 NOTE — Progress Notes (Signed)
Subjective:    Julia Rollins is a G2P1001 741w5d being seen today for her first obstetrical visit.  Referred by [facility].  Married since May 2020.  Her obstetrical history is significant for prior csection at term due to fetal distress. Patient does intend to breast feed. Recently diagnosed with bacterial vaginosis.  Desires the regular dose to take fewer times per day.  Pregnancy history fully reviewed.  Patient reports intermittent headaches.  +Nausea and vomiting in early pregnancy that has resolved.  Vitals:   06/24/19 0833  BP: 119/74  Pulse: 92  Temp: 98.2 F (36.8 C)  Weight: 198 lb 3.2 oz (89.9 kg)    HISTORY: OB History  Gravida Para Term Preterm AB Living  2 1 1     1   SAB TAB Ectopic Multiple Live Births          1    # Outcome Date GA Lbr Len/2nd Weight Sex Delivery Anes PTL Lv  2 Current           1 Term 10/09/17 6828w6d  7 lb (3.175 kg) M CS-LTranv Spinal N LIV     Complications: Decreased heart rate   Past Medical History:  Diagnosis Date  . Medical history non-contributory    Past Surgical History:  Procedure Laterality Date  . CESAREAN SECTION N/A 10/09/2017   Procedure: CESAREAN SECTION;  Surgeon: Linzie CollinEvans, David James, MD;  Location: [facility] ORS;  Service: Obstetrics;  Laterality: N/A;  . HERNIA REPAIR     89 or 28 years old   No family history on file.   Exam    BP 119/74   Pulse 92   Temp 98.2 F (36.8 C)   Wt 198 lb 3.2 oz (89.9 kg)   LMP 03/06/2019 (Exact Date)   BMI 31.99 kg/m  Uterine Size: size equals dates  Pelvic Exam:    Perineum: No Hemorrhoids, Normal Perineum   Vulva: normal   Vagina:  normal mucosa, +, no palpable nodules   pH: Not done   Cervix: no bleeding following Pap, no cervical motion tenderness and no lesions   Adnexa: normal adnexa and no mass, fullness, tenderness   Bony Pelvis: Adequate  System: Breast:  No nipple retraction or dimpling, No nipple discharge or bleeding, No axillary or supraclavicular adenopathy,  Normal to palpation without dominant masses   Skin: normal coloration and turgor, no rashes    Neurologic: negative   Extremities: normal strength, tone, and muscle mass   HEENT neck supple with midline trachea and thyroid without masses   Mouth/Teeth mucous membranes moist, pharynx normal without lesions   Neck supple and no masses   Cardiovascular: regular rate and rhythm, no murmurs or gallops   Respiratory:  appears well, vitals normal, no respiratory distress, acyanotic, normal RR, neck free of mass or lymphadenopathy, chest clear, no wheezing, crepitations, rhonchi, normal symmetric air entry   Abdomen: soft, non-tender; bowel sounds normal; no masses,  no organomegaly   Urinary: urethral meatus normal     Assessment:    Pregnancy: G2P1001 Patient Active Problem List   Diagnosis Date Noted  . Supervision of other normal pregnancy, antepartum 06/14/2019  . History of cesarean section 10/17/2017  . History of marijuana use 04/08/2017        Plan:     Initial labs drawn. +Panorama. Prenatal vitamins. Problem list reviewed and updated. Genetic Screening discussed:  Desires Panorama.    Ultrasound discussed; fetal survey: Scheduled for August  Follow up in 4 weeks.  Venia Carbon N Karim-Rhoades 06/24/2019

## 2019-06-24 NOTE — Patient Instructions (Signed)

## 2019-06-24 NOTE — Progress Notes (Signed)
Patient presents for New OB visit.

## 2019-06-27 LAB — CYTOLOGY - PAP: Diagnosis: NEGATIVE

## 2019-06-27 LAB — URINE CULTURE, OB REFLEX

## 2019-06-27 LAB — CULTURE, OB URINE

## 2019-06-28 LAB — AFP TETRA
DIA Mom Value: 0.62
DIA Value (EIA): 95.65 pg/mL
DSR (By Age)    1 IN: 878
DSR (Second Trimester) 1 IN: 10000
Gestational Age: 15 WEEKS
MSAFP Mom: 1.43
MSAFP: 37.5 ng/mL
MSHCG Mom: 0.53
MSHCG: 25973 m[IU]/mL
Maternal Age At EDD: 27.7 yr
Osb Risk: 6621
T18 (By Age): 1:3420 {titer}
Test Results:: NEGATIVE
Weight: 198 [lb_av]
uE3 Mom: 1.46
uE3 Value: 0.97 ng/mL

## 2019-06-28 LAB — OBSTETRIC PANEL, INCLUDING HIV
Antibody Screen: NEGATIVE
Basophils Absolute: 0 10*3/uL (ref 0.0–0.2)
Basos: 0 %
EOS (ABSOLUTE): 0 10*3/uL (ref 0.0–0.4)
Eos: 1 %
HIV Screen 4th Generation wRfx: NONREACTIVE
Hematocrit: 39.1 % (ref 34.0–46.6)
Hemoglobin: 12.8 g/dL (ref 11.1–15.9)
Hepatitis B Surface Ag: NEGATIVE
Immature Grans (Abs): 0 10*3/uL (ref 0.0–0.1)
Immature Granulocytes: 0 %
Lymphocytes Absolute: 1.4 10*3/uL (ref 0.7–3.1)
Lymphs: 27 %
MCH: 28.4 pg (ref 26.6–33.0)
MCHC: 32.7 g/dL (ref 31.5–35.7)
MCV: 87 fL (ref 79–97)
Monocytes Absolute: 0.4 10*3/uL (ref 0.1–0.9)
Monocytes: 7 %
Neutrophils Absolute: 3.6 10*3/uL (ref 1.4–7.0)
Neutrophils: 65 %
Platelets: 209 10*3/uL (ref 150–450)
RBC: 4.51 x10E6/uL (ref 3.77–5.28)
RDW: 13.6 % (ref 11.7–15.4)
RPR Ser Ql: NONREACTIVE
Rh Factor: POSITIVE
Rubella Antibodies, IGG: 5.44 index (ref >0.99)
WBC: 5.4 10*3/uL (ref 3.4–10.8)

## 2019-06-28 MED ORDER — NITROFURANTOIN MONOHYD MACRO 100 MG PO CAPS: 100.0000 mg | ORAL_CAPSULE | Freq: Two times a day (BID) | ORAL | 0 refills | Status: DC

## 2019-06-28 NOTE — Addendum Note (Signed)
Addended by: Cyndee Brightly on: 06/28/2019 02:52 PM   Modules accepted: Orders

## 2019-06-29 ENCOUNTER — Other Ambulatory Visit (HOSPITAL_COMMUNITY)
Admission: RE | Admit: 2019-06-29 | Discharge: 2019-06-29 | Disposition: A | Discharge status: Home / Self Care | Payer: Medicaid Other | Source: Ambulatory Visit | Attending: Family | Admitting: Family

## 2019-06-29 ENCOUNTER — Other Ambulatory Visit: Payer: Self-pay

## 2019-06-29 ENCOUNTER — Ambulatory Visit (INDEPENDENT_AMBULATORY_CARE_PROVIDER_SITE_OTHER): Payer: Medicaid Other | Admitting: *Deleted

## 2019-06-29 ENCOUNTER — Encounter: Payer: Self-pay | Admitting: General Practice

## 2019-06-29 DIAGNOSIS — Z3480 Encounter for supervision of other normal pregnancy, unspecified trimester: Secondary | ICD-10-CM

## 2019-06-29 DIAGNOSIS — Z113 Encounter for screening for infections with a predominantly sexual mode of transmission: Secondary | ICD-10-CM | POA: Insufficient documentation

## 2019-06-29 NOTE — Progress Notes (Signed)
Patient in clinic for GC/Chlamyida culture that was not completed at initial prenatal visit.   Derl Barrow, RN

## 2019-06-30 LAB — URINE CYTOLOGY ANCILLARY ONLY
Chlamydia: NEGATIVE
Neisseria Gonorrhea: NEGATIVE
Trichomonas: NEGATIVE

## 2019-07-01 ENCOUNTER — Encounter: Payer: Self-pay | Admitting: General Practice

## 2019-07-02 LAB — URINE CYTOLOGY ANCILLARY ONLY
Bacterial vaginitis: POSITIVE — AB
Candida vaginitis: NEGATIVE

## 2019-07-04 ENCOUNTER — Encounter: Payer: Self-pay | Admitting: General Practice

## 2019-07-07 ENCOUNTER — Telehealth: Payer: Self-pay | Admitting: *Deleted

## 2019-07-07 DIAGNOSIS — N898 Other specified noninflammatory disorders of vagina: Secondary | ICD-10-CM

## 2019-07-07 MED ORDER — TERCONAZOLE 0.4 % VA CREA: 1.0000 | TOPICAL_CREAM | Freq: Every day | VAGINAL | 0 refills | Status: DC

## 2019-07-07 NOTE — Telephone Encounter (Signed)
Patient called stating she is having severe vaginal itching. Pt is currently taking antibiotics for UTI. Medication for yeast infection sent to pharmacy.  Derl Barrow, RN

## 2019-07-13 ENCOUNTER — Telehealth: Payer: Self-pay | Admitting: *Deleted

## 2019-07-13 NOTE — Telephone Encounter (Signed)
Patient called stating she had a sharp pain at bottom of her stomach. She thought it was a braxton hicks. Advised patient that it could have been or her ligaments are stretching due to baby is growing. Advised patient is she is having constant sharp pain that is not relieved with resting on left side, drinking plenty of water or warm bath and/or vaginal bleeding, leaking of clear fluids she will need to go to [facility] for further evaluation.  Pt also reported that she has not restarted the Macrobid for UTI because the medication makes her sick. Advised patient to try taking medication with food and plenty of water. If medication is making her nausea then I can send in something to help with that. Verbal order given by Marcille Buffy, CNM to send Zofran 8 mg DOT PRN. Pt stated she does not need the medication at this time. Advised patient that she really need to complete the remaining course of medication. Pt stated understanding.   Derl Barrow, RN

## 2019-07-20 ENCOUNTER — Telehealth: Payer: Self-pay

## 2019-07-20 ENCOUNTER — Telehealth (INDEPENDENT_AMBULATORY_CARE_PROVIDER_SITE_OTHER): Payer: Medicaid Other | Admitting: Medical

## 2019-07-20 ENCOUNTER — Encounter: Payer: Self-pay | Admitting: Medical

## 2019-07-20 ENCOUNTER — Other Ambulatory Visit: Payer: Self-pay

## 2019-07-20 DIAGNOSIS — O34219 Maternal care for unspecified type scar from previous cesarean delivery: Secondary | ICD-10-CM

## 2019-07-20 DIAGNOSIS — Z3A19 19 weeks gestation of pregnancy: Secondary | ICD-10-CM

## 2019-07-20 DIAGNOSIS — O2342 Unspecified infection of urinary tract in pregnancy, second trimester: Secondary | ICD-10-CM

## 2019-07-20 DIAGNOSIS — Z3480 Encounter for supervision of other normal pregnancy, unspecified trimester: Secondary | ICD-10-CM

## 2019-07-20 NOTE — Progress Notes (Signed)
I connected with Julia Rollins on 07/20/19 at  4:10 PM EDT by: Mychart and verified that I am speaking with the correct person using two identifiers.  Patient is located at home and provider is located at [employer].     The purpose of this virtual visit is to provide medical care while limiting exposure to the novel coronavirus. I discussed the limitations, risks, security and privacy concerns of performing an evaluation and management service by MyChart and the availability of in person appointments. I also discussed with the patient that there may be a patient responsible charge related to this service. By engaging in this virtual visit, you consent to the provision of healthcare.  Additionally, you authorize for your insurance to be billed for the services provided during this visit.  The patient expressed understanding and agreed to proceed.  The following staff members participated in the virtual visit:  Darrick Penna, [place] VISIT NOTE  Subjective:  Julia Rollins is a 28 y.o. G2P1001 at [redacted]w[redacted]d  for phone visit for ongoing prenatal care.  She is currently monitored for the following issues for this low-risk pregnancy and has History of marijuana use; History of cesarean section; Supervision of other normal pregnancy, antepartum; and Previous cesarean delivery affecting pregnancy on their problem list.  Patient reports occasional contractions.  Contractions: Irritability. Vag. Bleeding: None.  Movement: Present. Denies leaking of fluid.   The following portions of the patient's history were reviewed and updated as appropriate: allergies, current medications, past family history, past medical history, past social history, past surgical history and problem list.   Objective:  There were no vitals filed for this visit. Self-Obtained  Fetal Status:     Movement: Present     Assessment and Plan:  Pregnancy: G2P1001 at [redacted]w[redacted]d 1. Previous cesarean delivery affecting pregnancy -  Planning TOLAC, advised of need to sign consent at next visit   2. Supervision of other normal pregnancy, antepartum - Doing well  - BP cuff has not arrived in the mail yet. Darrick Penna will call pharmacy to confirm.  - Requesting delayed cord clamping after delivery. Note made on sticky note.  - Desires flavor and dye free GTT. Will investigate if this can be ordered for the patient.   3. UTI (urinary tract infection) during pregnancy, second trimester - Did not complete last 2 days of antibiotics  - Patient will come to office tomorrow to leave urine sample for UA and culture to ensure asymptomatic bacteruria is treated  4. Yeast vaginitis - Developed yeast infection from antibiotics - Treated with coconut and tea tree oil, symptoms have resolved - Requests we confirm with test when she comes for lab visit tomorrow   Preterm labor symptoms and general obstetric precautions including but not limited to vaginal bleeding, contractions, leaking of fluid and fetal movement were reviewed in detail with the patient.  Return in about 8 weeks (around 09/14/2019) for LOB, 28 week labs (fasting), In-Person.  Future Appointments  Date Time Provider [place]  07/26/2019 12:30 PM WH-MFC [place] 1 WH-MFCUS MFC-US     Time spent on virtual visit: 15 minutes  Kerry Hough, PA-C

## 2019-07-20 NOTE — Progress Notes (Signed)
PT states she has not received BP Cuff in the mail yet

## 2019-07-20 NOTE — Telephone Encounter (Signed)
Pt states that she has not received her BP cuff in the mail. Rx was faxed on 7/15. I called [place] at [phone] and left a message for "Altha Harm" to have her check on this issue. Requested return call to office.

## 2019-07-20 NOTE — Patient Instructions (Signed)
Pregnancy and Urinary Tract Infection  A urinary tract infection (UTI) is an infection of any part of the urinary tract. This includes the kidneys, the tubes that connect your kidneys to your bladder (ureters), the bladder, and the tube that carries urine out of your body (urethra). These organs make, store, and get rid of urine in the body. Your health care provider may use other names to describe the infection. An upper UTI affects the ureters and kidneys (pyelonephritis). A lower UTI affects the bladder (cystitis) and urethra (urethritis). Most urinary tract infections are caused by bacteria in your genital area, around the entrance to your urinary tract (urethra). These bacteria grow and cause irritation and inflammation of your urinary tract. You are more likely to develop a UTI during pregnancy because the physical and hormonal changes your body goes through can make it easier for bacteria to get into your urinary tract. Your growing baby also puts pressure on your bladder and can affect urine flow. It is important to recognize and treat UTIs in pregnancy because of the risk of serious complications for both you and your baby. How does this affect me? Symptoms of a UTI include:  Needing to urinate right away (urgently).  Frequent urination or passing small amounts of urine frequently.  Pain or burning with urination.  Blood in the urine.  Urine that smells bad or unusual.  Trouble urinating.  Cloudy urine.  Pain in the abdomen or lower back.  Vaginal discharge. You may also have:  Vomiting or a decreased appetite.  Confusion.  Irritability or tiredness.  A fever.  Diarrhea. How does this affect my baby? An untreated UTI during pregnancy could lead to a kidney infection or a systemic infection, which can cause health problems that could affect your baby. Possible complications of an untreated UTI include:  Giving birth to your baby before 37 weeks of pregnancy (premature).   Having a baby with a low birth weight.  Developing high blood pressure during pregnancy (preeclampsia).  Having a low hemoglobin level (anemia). What can I do to lower my risk? To prevent a UTI:  Go to the bathroom as soon as you feel the need. Do not hold urine for long periods of time.  Always wipe from front to back, especially after a bowel movement. Use each tissue one time when you wipe.  Empty your bladder after sex.  Keep your genital area dry.  Drink 6-10 glasses of water each day.  Do not douche or use deodorant sprays. How is this treated? Treatment for this condition may include:  Antibiotic medicines that are safe to take during pregnancy.  Other medicines to treat less common causes of UTI. Follow these instructions at home:  If you were prescribed an antibiotic medicine, take it as told by your health care provider. Do not stop using the antibiotic even if you start to feel better.  Keep all follow-up visits as told by your health care provider. This is important. Contact a health care provider if:  Your symptoms do not improve or they get worse.  You have abnormal vaginal discharge. Get help right away if you:  Have a fever.  Have nausea and vomiting.  Have back or side pain.  Feel contractions in your uterus.  Have lower belly pain.  Have a gush of fluid from your vagina.  Have blood in your urine. Summary  A urinary tract infection (UTI) is an infection of any part of the urinary tract, which includes the  kidneys, ureters, bladder, and urethra.  Most urinary tract infections are caused by bacteria in your genital area, around the entrance to your urinary tract (urethra).  You are more likely to develop a UTI during pregnancy.  If you were prescribed an antibiotic medicine, take it as told by your health care provider. Do not stop using the antibiotic even if you start to feel better. This information is not intended to replace advice  given to you by your health care provider. Make sure you discuss any questions you have with your health care provider. Document Released: 03/28/2011 Document Revised: 03/25/2019 Document Reviewed: 11/04/2018 Elsevier Patient Education  2020 [employer] Second Trimester of Pregnancy  The second trimester is from week 14 through week 27 (month 4 through 6). This is often the time in pregnancy that you feel your best. Often times, morning sickness has lessened or quit. You may have more energy, and you may get hungry more often. Your unborn baby is growing rapidly. At the end of the sixth month, he or she is about 9 inches long and weighs about 1 pounds. You will likely feel the baby move between 18 and 20 weeks of pregnancy. Follow these instructions at home: Medicines  Take over-the-counter and prescription medicines only as told by your doctor. Some medicines are safe and some medicines are not safe during pregnancy.  Take a prenatal vitamin that contains at least 600 micrograms (mcg) of folic acid.  If you have trouble pooping (constipation), take medicine that will make your stool soft (stool softener) if your doctor approves. Eating and drinking   Eat regular, healthy meals.  Avoid raw meat and uncooked cheese.  If you get low calcium from the food you eat, talk to your doctor about taking a daily calcium supplement.  Avoid foods that are high in fat and sugars, such as fried and sweet foods.  If you feel sick to your stomach (nauseous) or throw up (vomit): ? Eat 4 or 5 small meals a day instead of 3 large meals. ? Try eating a few soda crackers. ? Drink liquids between meals instead of during meals.  To prevent constipation: ? Eat foods that are high in fiber, like fresh fruits and vegetables, whole grains, and beans. ? Drink enough fluids to keep your pee (urine) clear or pale yellow. Activity  Exercise only as told by your doctor. Stop exercising if you start to have  cramps.  Do not exercise if it is too hot, too humid, or if you are in a place of great height (high altitude).  Avoid heavy lifting.  Wear low-heeled shoes. Sit and stand up straight.  You can continue to have sex unless your doctor tells you not to. Relieving pain and discomfort  Wear a good support bra if your breasts are tender.  Take warm water baths (sitz baths) to soothe pain or discomfort caused by hemorrhoids. Use hemorrhoid cream if your doctor approves.  Rest with your legs raised if you have leg cramps or low back pain.  If you develop puffy, bulging veins (varicose veins) in your legs: ? Wear support hose or compression stockings as told by your doctor. ? Raise (elevate) your feet for 15 minutes, 3-4 times a day. ? Limit salt in your food. Prenatal care  Write down your questions. Take them to your prenatal visits.  Keep all your prenatal visits as told by your doctor. This is important. Safety  Wear your seat belt when driving.  Make a  list of emergency phone numbers, including numbers for family, friends, the hospital, and police and fire departments. General instructions  Ask your doctor about the right foods to eat or for help finding a counselor, if you need these services.  Ask your doctor about local prenatal classes. Begin classes before month 6 of your pregnancy.  Do not use hot tubs, steam rooms, or saunas.  Do not douche or use tampons or scented sanitary pads.  Do not [place] your legs for long periods of time.  Visit your dentist if you have not done so. Use a soft toothbrush to brush your teeth. Floss gently.  Avoid all smoking, herbs, and alcohol. Avoid drugs that are not approved by your doctor.  Do not use any products that contain nicotine or tobacco, such as cigarettes and e-cigarettes. If you need help quitting, ask your doctor.  Avoid cat litter boxes and soil used by cats. These carry germs that can cause birth defects in the baby and  can cause a loss of your baby (miscarriage) or stillbirth. Contact a doctor if:  You have mild cramps or pressure in your lower belly.  You have pain when you pee (urinate).  You have bad smelling fluid coming from your vagina.  You continue to feel sick to your stomach (nauseous), throw up (vomit), or have watery poop (diarrhea).  You have a nagging pain in your belly area.  You feel dizzy. Get help right away if:  You have a fever.  You are leaking fluid from your vagina.  You have spotting or bleeding from your vagina.  You have severe belly cramping or pain.  You lose or gain weight rapidly.  You have trouble catching your breath and have chest pain.  You notice sudden or extreme puffiness (swelling) of your face, hands, ankles, feet, or legs.  You have not felt the baby move in over an hour.  You have severe headaches that do not go away when you take medicine.  You have trouble seeing. Summary  The second trimester is from week 14 through week 27 (months 4 through 6). This is often the time in pregnancy that you feel your best.  To take care of yourself and your unborn baby, you will need to eat healthy meals, take medicines only if your doctor tells you to do so, and do activities that are safe for you and your baby.  Call your doctor if you get sick or if you notice anything unusual about your pregnancy. Also, call your doctor if you need help with the right food to eat, or if you want to know what activities are safe for you. This information is not intended to replace advice given to you by your health care provider. Make sure you discuss any questions you have with your health care provider. Document Released: 02/25/2010 Document Revised: 03/25/2019 Document Reviewed: 01/06/2017 Elsevier Patient Education  2020 [employer]

## 2019-07-21 ENCOUNTER — Other Ambulatory Visit (HOSPITAL_COMMUNITY)
Admission: RE | Admit: 2019-07-21 | Discharge: 2019-07-21 | Disposition: A | Discharge status: Home / Self Care | Payer: Medicaid Other | Source: Ambulatory Visit | Attending: Women's Health | Admitting: Women's Health

## 2019-07-21 ENCOUNTER — Ambulatory Visit (INDEPENDENT_AMBULATORY_CARE_PROVIDER_SITE_OTHER): Payer: Medicaid Other | Admitting: *Deleted

## 2019-07-21 VITALS — BP 113/75 | HR 80 | Temp 98.7°F | Wt 208.0 lb

## 2019-07-21 DIAGNOSIS — N898 Other specified noninflammatory disorders of vagina: Secondary | ICD-10-CM | POA: Diagnosis present

## 2019-07-21 LAB — POCT URINALYSIS DIPSTICK OB
Bilirubin, UA: NEGATIVE
Blood, UA: NEGATIVE
Glucose, UA: NEGATIVE
Ketones, UA: NEGATIVE
Leukocytes, UA: NEGATIVE
Nitrite, UA: NEGATIVE
POC,PROTEIN,UA: NEGATIVE
Spec Grav, UA: 1.02 (ref 1.010–1.025)
Urobilinogen, UA: 0.2 E.U./dL
pH, UA: 8.5 — AB (ref 5.0–8.0)

## 2019-07-21 NOTE — Progress Notes (Signed)
   SUBJECTIVE:  28 y.o. female complains of vaginal itching for 1 day(s). Denies abnormal vaginal bleeding or significant pelvic pain or fever. No UTI symptoms. Denies history of known exposure to STD.  Patient's last menstrual period was 03/06/2019 (exact date).   Patient had MyChart visit with Kerry Hough, PA 07/20/2019. Pt also need UA/culture due to not completing last 2 days of antibiotics. Patient also was treating yeast infection with tea tree oil and requested another vaginal swab to confirm results of last test.  OBJECTIVE:  She appears well, afebrile. Urine dipstick: negative for all components; pH 8.5.  ASSESSMENT:  Vaginal Itching   PLAN:  GC, chlamydia, trichomonas, BVAG, CVAG probe sent to lab. Treatment: To be determined once lab results are received ROV prn if symptoms persist or worsen.  Derl Barrow, RN

## 2019-07-22 ENCOUNTER — Telehealth: Payer: Medicaid Other | Admitting: Obstetrics and Gynecology

## 2019-07-23 LAB — CULTURE, OB URINE

## 2019-07-23 LAB — URINE CULTURE, OB REFLEX: Organism ID, Bacteria: NO GROWTH

## 2019-07-26 ENCOUNTER — Other Ambulatory Visit: Payer: Self-pay

## 2019-07-26 ENCOUNTER — Ambulatory Visit (HOSPITAL_COMMUNITY)
Admission: RE | Admit: 2019-07-26 | Discharge: 2019-07-26 | Disposition: A | Discharge status: Home / Self Care | Payer: Medicaid Other | Source: Ambulatory Visit | Attending: Obstetrics and Gynecology | Admitting: Obstetrics and Gynecology

## 2019-07-26 DIAGNOSIS — Z363 Encounter for antenatal screening for malformations: Secondary | ICD-10-CM

## 2019-07-26 DIAGNOSIS — Z3480 Encounter for supervision of other normal pregnancy, unspecified trimester: Secondary | ICD-10-CM | POA: Insufficient documentation

## 2019-07-26 DIAGNOSIS — Z3A20 20 weeks gestation of pregnancy: Secondary | ICD-10-CM | POA: Diagnosis not present

## 2019-07-28 LAB — CERVICOVAGINAL ANCILLARY ONLY
Bacterial vaginitis: NEGATIVE
Candida vaginitis: NEGATIVE
Chlamydia: NEGATIVE
Neisseria Gonorrhea: NEGATIVE
Trichomonas: NEGATIVE

## 2019-09-05 ENCOUNTER — Telehealth: Payer: Self-pay | Admitting: *Deleted

## 2019-09-05 NOTE — Telephone Encounter (Signed)
Patient called stating she is having thick, white vaginal discharge that has a sweet smell and vaginal itching x 3 days. Pt does not have transportation to come into clinic until after 5 PM. Advised patient that I can refill her vaginal cream from previous visit. However, patient stated she has cream left over from last time and would use that. Advised patient if she did not have enough to last 5-7 days to send a Mychart message or call the office for a refill.  Derl Barrow, RN

## 2019-09-15 ENCOUNTER — Encounter: Payer: Medicaid Other | Admitting: Obstetrics and Gynecology

## 2019-09-19 ENCOUNTER — Other Ambulatory Visit: Payer: Self-pay | Admitting: *Deleted

## 2019-09-19 DIAGNOSIS — Z3480 Encounter for supervision of other normal pregnancy, unspecified trimester: Secondary | ICD-10-CM

## 2019-09-19 NOTE — Progress Notes (Signed)
Future orders to be completed at visit 09/20/2019.  Derl Barrow, RN

## 2019-09-20 ENCOUNTER — Other Ambulatory Visit: Payer: Self-pay

## 2019-09-20 ENCOUNTER — Other Ambulatory Visit: Payer: Medicaid Other

## 2019-09-20 DIAGNOSIS — Z3480 Encounter for supervision of other normal pregnancy, unspecified trimester: Secondary | ICD-10-CM

## 2019-09-21 LAB — CBC
Hematocrit: 36.3 % (ref 34.0–46.6)
Hemoglobin: 11.7 g/dL (ref 11.1–15.9)
MCH: 27.6 pg (ref 26.6–33.0)
MCHC: 32.2 g/dL (ref 31.5–35.7)
MCV: 86 fL (ref 79–97)
Platelets: 190 10*3/uL (ref 150–450)
RBC: 4.24 x10E6/uL (ref 3.77–5.28)
RDW: 13.4 % (ref 11.7–15.4)
WBC: 5.7 10*3/uL (ref 3.4–10.8)

## 2019-09-21 LAB — HIV ANTIBODY (ROUTINE TESTING W REFLEX): HIV Screen 4th Generation wRfx: NONREACTIVE

## 2019-09-21 LAB — RPR: RPR Ser Ql: NONREACTIVE

## 2019-09-21 LAB — GLUCOSE TOLERANCE, 2 HOURS W/ 1HR
Glucose, 1 hour: 132 mg/dL (ref 65–179)
Glucose, 2 hour: 94 mg/dL (ref 65–152)
Glucose, Fasting: 73 mg/dL (ref 65–91)

## 2019-09-27 ENCOUNTER — Institutional Professional Consult (permissible substitution): Payer: Medicaid Other | Admitting: Obstetrics and Gynecology

## 2019-09-29 ENCOUNTER — Other Ambulatory Visit: Payer: Self-pay | Admitting: *Deleted

## 2019-09-29 DIAGNOSIS — Z3480 Encounter for supervision of other normal pregnancy, unspecified trimester: Secondary | ICD-10-CM

## 2019-09-29 MED ORDER — BLOOD PRESSURE MONITOR AUTOMAT DEVI: 1.0000 | Freq: Every day | 0 refills | Status: DC

## 2019-09-29 NOTE — Progress Notes (Signed)
Blood pressure Rx sent to [facility].  Derl Barrow, RN

## 2019-10-10 ENCOUNTER — Institutional Professional Consult (permissible substitution): Payer: Medicaid Other | Admitting: Obstetrics & Gynecology

## 2019-10-12 ENCOUNTER — Encounter: Payer: Self-pay | Admitting: Obstetrics & Gynecology

## 2019-10-12 ENCOUNTER — Other Ambulatory Visit: Payer: Self-pay

## 2019-10-12 ENCOUNTER — Ambulatory Visit (INDEPENDENT_AMBULATORY_CARE_PROVIDER_SITE_OTHER): Payer: Medicaid Other | Admitting: Obstetrics & Gynecology

## 2019-10-12 DIAGNOSIS — Z3480 Encounter for supervision of other normal pregnancy, unspecified trimester: Secondary | ICD-10-CM

## 2019-10-12 DIAGNOSIS — Z3A31 31 weeks gestation of pregnancy: Secondary | ICD-10-CM

## 2019-10-12 DIAGNOSIS — O34219 Maternal care for unspecified type scar from previous cesarean delivery: Secondary | ICD-10-CM

## 2019-10-12 NOTE — Patient Instructions (Signed)
Vaginal Birth After Cesarean Delivery  Vaginal birth after cesarean delivery (VBAC) is giving birth vaginally after previously delivering a baby through a cesarean section (C-section). A VBAC may be a safe option for you, depending on your health and other factors. It is important to discuss VBAC with your health care provider early in your pregnancy so you can understand the risks, benefits, and options. Having these discussions early will give you time to make your birth plan. Who are the best candidates for VBAC? The best candidates for VBAC are women who:  Have had one or two prior cesarean deliveries, and the incision made during the delivery was horizontal (low transverse).  Do not have a vertical (classical) scar on their uterus.  Have not had a tear in the wall of their uterus (uterine rupture).  Plan to have more pregnancies. A VBAC is also more likely to be successful:  In women who have previously given birth vaginally.  When labor starts by itself (spontaneously) before the due date. What are the benefits of VBAC? The benefits of delivering your baby vaginally instead of by a cesarean delivery include:  A shorter hospital stay.  A faster recovery time.  Less pain.  Avoiding risks associated with major surgery, such as infection and blood clots.  Less blood loss and less need for donated blood (transfusions). What are the risks of VBAC? The main risk of attempting a VBAC is that it may fail, forcing your health care provider to deliver your baby by a C-section. Other risks are rare and include:  Tearing (rupture) of the scar from a past cesarean delivery.  Other risks associated with vaginal deliveries. If a repeat cesarean delivery is needed, the risks include:  Blood loss.  Infection.  Blood clot.  Damage to surrounding organs.  Removal of the uterus (hysterectomy), if it is damaged.  Placenta problems in future pregnancies. What else should I know  about my options? Delivering a baby through a VBAC is similar to having a normal spontaneous vaginal delivery. Therefore, it is safe:  To try with twins.  For your health care provider to try to turn the baby from a breech position (external cephalic version) during labor.  With epidural analgesia for pain relief. Consider where you would like to deliver your baby. VBAC should be attempted in facilities where an emergency cesarean delivery can be performed. VBAC is not recommended for home births. Any changes in your health or your baby's health during your pregnancy may make it necessary to change your initial decision about VBAC. Your health care provider may recommend that you do not attempt a VBAC if:  Your baby's suspected weight is 8.8 lb (4 kg) or more.  You have preeclampsia. This is a condition that causes high blood pressure along with other symptoms, such as swelling and headaches.  You will have VBAC less than 19 months after your cesarean delivery.  You are past your due date.  You need to have labor started (induced) because your cervix is not ready for labor (unfavorable). Where to find more information  American Pregnancy Association: americanpregnancy.org  American Congress of Obstetricians and Gynecologists: acog.org Summary  Vaginal birth after cesarean delivery (VBAC) is giving birth vaginally after previously delivering a baby through a cesarean section (C-section). A VBAC may be a safe option for you, depending on your health and other factors.  Discuss VBAC with your health care provider early in your pregnancy so you can understand the risks, benefits, options, and   have plenty of time to make your birth plan.  The main risk of attempting a VBAC is that it may fail, forcing your health care provider to deliver your baby by a C-section. Other risks are rare. This information is not intended to replace advice given to you by your health care provider. Make sure  you discuss any questions you have with your health care provider. Document Released: 05/24/2007 Document Revised: 03/29/2019 Document Reviewed: 03/10/2017 Elsevier Patient Education  2020 Elsevier Inc.  

## 2019-10-12 NOTE — Progress Notes (Signed)
PRENATAL VISIT NOTE  Subjective:  Julia Rollins is a 28 y.o. G2P1001 at [redacted]w[redacted]d being seen today for ongoing prenatal care.  She is currently monitored for the following issues for this high-risk pregnancy and has History of marijuana use; History of cesarean section; Supervision of other normal pregnancy, antepartum; and Previous cesarean delivery affecting pregnancy on their problem list.  Patient reports no complaints.  Contractions: Irritability. Vag. Bleeding: None.  Movement: Present. Denies leaking of fluid.   The following portions of the patient's history were reviewed and updated as appropriate: allergies, current medications, past family history, past medical history, past social history, past surgical history and problem list.   Objective:   Vitals:   10/12/19 1422  BP: 106/68  Pulse: 90  Weight: 223 lb (101.2 kg)    Fetal Status: Fetal Heart Rate (bpm): 148   Movement: Present     General:  Alert, oriented and cooperative. Patient is in no acute distress.  Skin: Skin is warm and dry. No rash noted.   Cardiovascular: Normal heart rate noted  Respiratory: Normal respiratory effort, no problems with respiration noted  Abdomen: Soft, gravid, appropriate for gestational age.  Pain/Pressure: Present     Pelvic: Cervical exam deferred        Extremities: Normal range of motion.  Edema: None  Mental Status: Normal mood and affect. Normal behavior. Normal judgment and thought content.   Assessment and Plan:  Pregnancy: G2P1001 at [redacted]w[redacted]d 1. Previous cesarean delivery affecting pregnancy Plans TOLAC. [facility] Attending Consult Note  28 y.o. G2P1001 at [redacted]w[redacted]d with Estimated Date of Delivery: 12/11/19 was seen today in office to discuss trial of labor after cesarean section (TOLAC) versus elective repeat cesarean delivery (ERCD). The following risks were discussed with the patient.  Risk of uterine rupture at term is 0.78 percent with TOLAC and 0.22 percent with  ERCD. 1 in 10 uterine ruptures will result in neonatal death or neurological injury. The benefits of a trial of labor after cesarean (TOLAC) resulting in a vaginal birth after cesarean (VBAC) include the following: shorter length of hospital stay and postpartum recovery (in most cases); fewer complications, such as postpartum fever, wound or uterine infection, thromboembolism (blood clots in the leg or lung), need for blood transfusion and fewer neonatal breathing problems. The risks of an attempted VBAC or TOLAC include the following: . Risk of failed trial of labor after cesarean (TOLAC) without a vaginal birth after cesarean (VBAC) resulting in repeat cesarean delivery (RCD) in about 20 to 40 percent of women who attempt VBAC.  Marland Kitchen Risk of rupture of uterus resulting in an emergency cesarean delivery. The risk of uterine rupture may be related in part to the type of uterine incision made during the first cesarean delivery. A previous transverse uterine incision has the lowest risk of rupture (0.2 to 1.5 percent risk). Vertical or T-shaped uterine incisions have a higher risk of uterine rupture (4 to 9 percent risk)The risk of fetal death is very low with both VBAC and elective repeat cesarean delivery (ERCD), but the likelihood of fetal death is higher with VBAC than with ERCD. Maternal death is very rare with either type of delivery. The risks of an elective repeat cesarean delivery (ERCD) were reviewed with the patient including but not limited to: 01/999 risk of uterine rupture which could have serious consequences, bleeding which may require transfusion; infection which may require antibiotics; injury to bowel, bladder or other surrounding organs (bowel, bladder, ureters); injury to the fetus;  need for additional procedures including hysterectomy in the event of a life-threatening hemorrhage; thromboembolic phenomenon; abnormal placentation; incisional problems; death and other postoperative or anesthesia  complications.    These risks and benefits are summarized on the consent form, which was reviewed with the patient during the visit.  All her questions answered and she signed a consent indicating a preference for TOLAC/ERCD. A copy of the consent was given to the patient.  Will continue routine postpartum care.       2. Supervision of other normal pregnancy, antepartum   Preterm labor symptoms and general obstetric precautions including but not limited to vaginal bleeding, contractions, leaking of fluid and fetal movement were reviewed in detail with the patient. Please refer to After Visit Summary for other counseling recommendations.   Return in about 1 week (around 10/19/2019) for [facility].  Future Appointments  Date Time Provider [place]  10/20/2019  2:30 PM Laury Deep, CNM [facility] None    Emeterio Reeve, MD

## 2019-10-12 NOTE — Progress Notes (Signed)
ROB   CC: None    

## 2019-10-13 ENCOUNTER — Telehealth: Payer: Medicaid Other | Admitting: Obstetrics and Gynecology

## 2019-10-20 ENCOUNTER — Telehealth: Payer: Medicaid Other | Admitting: Obstetrics and Gynecology

## 2019-10-20 ENCOUNTER — Telehealth: Payer: Self-pay | Admitting: General Practice

## 2019-10-20 ENCOUNTER — Encounter: Payer: Self-pay | Admitting: General Practice

## 2019-10-20 NOTE — Telephone Encounter (Signed)
Pt was a No Show for today's Mychart visit.  Message left for patient to give our office a call to reschedule.

## 2019-11-03 ENCOUNTER — Encounter: Payer: Self-pay | Admitting: Obstetrics and Gynecology

## 2019-11-03 ENCOUNTER — Telehealth (INDEPENDENT_AMBULATORY_CARE_PROVIDER_SITE_OTHER): Payer: Medicaid Other | Admitting: Obstetrics and Gynecology

## 2019-11-03 ENCOUNTER — Other Ambulatory Visit: Payer: Self-pay

## 2019-11-03 DIAGNOSIS — Z3480 Encounter for supervision of other normal pregnancy, unspecified trimester: Secondary | ICD-10-CM

## 2019-11-03 DIAGNOSIS — O34219 Maternal care for unspecified type scar from previous cesarean delivery: Secondary | ICD-10-CM

## 2019-11-03 DIAGNOSIS — Z3A34 34 weeks gestation of pregnancy: Secondary | ICD-10-CM

## 2019-11-03 NOTE — Progress Notes (Addendum)
   MY CHART VIDEO VIRTUAL OBSTETRICS VISIT ENCOUNTER NOTE  I connected with Julia Rollins on 11/03/19 at  9:10 AM EST by My Chart video at home and verified that I am speaking with the correct person using two identifiers.   I discussed the limitations, risks, security and privacy concerns of performing an evaluation and management service by My Chart video and the availability of in person appointments. I also discussed with the patient that there may be a patient responsible charge related to this service. The patient expressed understanding and agreed to proceed.  Subjective:  Julia Rollins is a 28 y.o. G2P1001 at [redacted]w[redacted]d being followed for ongoing prenatal care.  She is currently monitored for the following issues for this low-risk pregnancy and has History of marijuana use; History of cesarean section; Supervision of other normal pregnancy, antepartum; and Previous cesarean delivery affecting pregnancy on their problem list.  Patient reports no complaints. Wants to discuss expectations for delivering with [facility]. Reports fetal movement. Denies any contractions, bleeding or leaking of fluid.   The following portions of the patient's history were reviewed and updated as appropriate: allergies, current medications, past family history, past medical history, past social history, past surgical history and problem list.   Objective:   General:  Alert, oriented and cooperative.   Mental Status: Normal mood and affect perceived. Normal judgment and thought content.  Rest of physical exam deferred due to type of encounter  BP 114/69   Pulse 87   LMP 03/06/2019 (Exact Date)  **Done by patient's own at home BP cuff patient does not own a scale  Assessment and Plan:  Pregnancy: G2P1001 at [redacted]w[redacted]d  1. Previous cesarean delivery affecting pregnancy - TOLAC consent on file (done wit Dr. Roselie Awkward)  2. Supervision of other normal pregnancy, antepartum - Discussed The nature of [place] with multiple MDs and other Advanced Practice Providers was explained to patient; also emphasized that residents, students are part of our team.  - Discussed optimized OB schedule and video visits. Advised can have an in-office visit whenever she feels she needs to be seen.  - Demonstration and teaching on use of BP cuff. Return demonstration given by patient. Patient verbalizes an understanding Advised to take BP daily and call office, if >140/90. Advised to call during normal business hours and there is an after-hours nurse line available.   Preterm labor symptoms and general obstetric precautions including but not limited to vaginal bleeding, contractions, leaking of fluid and fetal movement were reviewed in detail with the patient.  I discussed the assessment and treatment plan with the patient. The patient was provided an opportunity to ask questions and all were answered. The patient agreed with the plan and demonstrated an understanding of the instructions. The patient was advised to call back or seek an in-person office evaluation/[facility] for any urgent or concerning symptoms. Please refer to After Visit Summary for other counseling recommendations.   I provided 10 minutes of non-face-to-face time during this encounter. There was 5 minutes of chart review time spent prior to this encounter. Total time spent = 15 minutes.  Return in about 2 weeks (around 11/17/2019) for Return OB visit.  Future Appointments  Date Time Provider [place]  11/17/2019  1:10 PM Laury Deep, CNM [facility] None    Laury Deep, [place] for [employer], [place]

## 2019-11-09 ENCOUNTER — Telehealth: Payer: Medicaid Other | Admitting: Obstetrics and Gynecology

## 2019-11-15 ENCOUNTER — Telehealth: Payer: Self-pay | Admitting: *Deleted

## 2019-11-15 NOTE — Telephone Encounter (Signed)
Patient called stating feeling like something is pulling at her uterus. Advised patient to try taking tylenol, warm bath and laying on left side. Pt stated she does not have Tylenol at home to take and it hurts really bad to lay on left side. Advised patient if pain is severe she should be seen at [facility]U. There is no provider in clinic today.  Derl Barrow, RN

## 2019-11-16 ENCOUNTER — Encounter: Payer: Self-pay | Admitting: Certified Nurse Midwife

## 2019-11-16 ENCOUNTER — Other Ambulatory Visit: Payer: Self-pay

## 2019-11-16 ENCOUNTER — Other Ambulatory Visit (HOSPITAL_COMMUNITY)
Admission: RE | Admit: 2019-11-16 | Discharge: 2019-11-16 | Disposition: A | Discharge status: Home / Self Care | Payer: Medicaid Other | Source: Ambulatory Visit | Attending: Obstetrics and Gynecology | Admitting: Obstetrics and Gynecology

## 2019-11-16 ENCOUNTER — Ambulatory Visit (INDEPENDENT_AMBULATORY_CARE_PROVIDER_SITE_OTHER): Payer: Medicaid Other | Admitting: Certified Nurse Midwife

## 2019-11-16 VITALS — BP 125/75 | HR 86 | Temp 98.0°F | Wt 231.8 lb

## 2019-11-16 DIAGNOSIS — Z3A36 36 weeks gestation of pregnancy: Secondary | ICD-10-CM

## 2019-11-16 DIAGNOSIS — Z3483 Encounter for supervision of other normal pregnancy, third trimester: Secondary | ICD-10-CM

## 2019-11-16 DIAGNOSIS — Z3480 Encounter for supervision of other normal pregnancy, unspecified trimester: Secondary | ICD-10-CM

## 2019-11-16 DIAGNOSIS — B373 Candidiasis of vulva and vagina: Secondary | ICD-10-CM

## 2019-11-16 DIAGNOSIS — B3731 Acute candidiasis of vulva and vagina: Secondary | ICD-10-CM

## 2019-11-16 DIAGNOSIS — O34219 Maternal care for unspecified type scar from previous cesarean delivery: Secondary | ICD-10-CM

## 2019-11-16 MED ORDER — FLUCONAZOLE 150 MG PO TABS: 150.0000 mg | ORAL_TABLET | Freq: Every day | ORAL | 0 refills | Status: DC

## 2019-11-16 NOTE — Progress Notes (Signed)
   PRENATAL VISIT NOTE  Subjective:  Julia Rollins is a 28 y.o. G2P1001 at [redacted]w[redacted]d being seen today for ongoing prenatal care.  She is currently monitored for the following issues for this low-risk pregnancy and has History of marijuana use; History of cesarean section; Supervision of other normal pregnancy, antepartum; and Previous cesarean delivery affecting pregnancy on their problem list.  Patient reports occasional contractions.  Contractions: Irregular. Vag. Bleeding: None.  Movement: Present. Denies leaking of fluid.   The following portions of the patient's history were reviewed and updated as appropriate: allergies, current medications, past family history, past medical history, past social history, past surgical history and problem list.   Objective:   Vitals:   11/16/19 1403  BP: 125/75  Pulse: 86  Temp: 98 F (36.7 C)  Weight: 231 lb 12.8 oz (105.1 kg)    Fetal Status: Fetal Heart Rate (bpm): 137 Fundal Height: 38 cm Movement: Present  Presentation: Vertex  General:  Alert, oriented and cooperative. Patient is in no acute distress.  Skin: Skin is warm and dry. No rash noted.   Cardiovascular: Normal heart rate noted  Respiratory: Normal respiratory effort, no problems with respiration noted  Abdomen: Soft, gravid, appropriate for gestational age.  Pain/Pressure: Present     Pelvic: Cervical exam performed Dilation: 1 Effacement (%): Thick Station: -3  Extremities: Normal range of motion.  Edema: None  Mental Status: Normal mood and affect. Normal behavior. Normal judgment and thought content.   Assessment and Plan:  Pregnancy: G2P1001 at [redacted]w[redacted]d 1. Supervision of other normal pregnancy, antepartum - Patient doing well, reports occasional BH contractions, patient also reports white think curdy discharge that has been present for the past 2 days - believes she has a yeast infection  - Routine prenatal care - Anticipatory guidance on upcoming appointments with next one being  [place] appointment around 38 weeks  - Labor precautions discussed  - Cervicovaginal ancillary only( [place]) - Culture, beta strep (group b only)  2. Previous cesarean delivery affecting pregnancy - Plans for TOLAC - consent signed at previous appointment and is scanned under media   3. Vulvovaginal candidiasis - Will treat prophylactic for yeast infection while swabs pending - Will call message patient if swabs positive for BV and manage accordingly  - fluconazole (DIFLUCAN) 150 MG tablet; Take 1 tablet (150 mg total) by mouth daily.  Dispense: 1 tablet; Refill: 0  Preterm labor symptoms and general obstetric precautions including but not limited to vaginal bleeding, contractions, leaking of fluid and fetal movement were reviewed in detail with the patient. Please refer to After Visit Summary for other counseling recommendations.   Return in about 2 weeks (around 11/30/2019) for ROB- mychart.  Future Appointments  Date Time Provider [place]  12/01/2019  2:10 PM Laury Deep, [place] None    Lajean Manes, [place]

## 2019-11-17 ENCOUNTER — Encounter: Payer: Medicaid Other | Admitting: Obstetrics and Gynecology

## 2019-11-17 LAB — CERVICOVAGINAL ANCILLARY ONLY
Bacterial Vaginitis (gardnerella): NEGATIVE
Candida Glabrata: NEGATIVE
Candida Vaginitis: NEGATIVE
Chlamydia: NEGATIVE
Comment: NEGATIVE
Comment: NEGATIVE
Comment: NEGATIVE
Comment: NEGATIVE
Comment: NEGATIVE
Comment: NORMAL
Neisseria Gonorrhea: NEGATIVE
Trichomonas: NEGATIVE

## 2019-11-20 LAB — CULTURE, BETA STREP (GROUP B ONLY): Strep Gp B Culture: NEGATIVE

## 2019-12-01 ENCOUNTER — Telehealth (INDEPENDENT_AMBULATORY_CARE_PROVIDER_SITE_OTHER): Payer: Medicaid Other | Admitting: Obstetrics and Gynecology

## 2019-12-01 ENCOUNTER — Other Ambulatory Visit: Payer: Self-pay

## 2019-12-01 DIAGNOSIS — Z3480 Encounter for supervision of other normal pregnancy, unspecified trimester: Secondary | ICD-10-CM

## 2019-12-01 DIAGNOSIS — Z3A39 39 weeks gestation of pregnancy: Secondary | ICD-10-CM

## 2019-12-01 DIAGNOSIS — O34219 Maternal care for unspecified type scar from previous cesarean delivery: Secondary | ICD-10-CM

## 2019-12-01 NOTE — Patient Instructions (Signed)
Hypertension During Pregnancy °High blood pressure (hypertension) is when the force of blood pumping through the arteries is too strong. Arteries are blood vessels that carry blood from the heart throughout the body. Hypertension during pregnancy can be mild or severe. Severe hypertension during pregnancy (preeclampsia) is a medical emergency that requires prompt evaluation and treatment. °Different types of hypertension can happen during pregnancy. These include: °· Chronic hypertension. This happens when you had high blood pressure before you became pregnant, and it continues during the pregnancy. Hypertension that develops before you are [redacted] weeks pregnant and continues during the pregnancy is also called chronic hypertension. If you have chronic hypertension, it will not go away after you have your baby. You will need follow-up visits with your health care provider after you have your baby. Your doctor may want you to keep taking medicine for your blood pressure. °· Gestational hypertension. This is hypertension that develops after the 20th week of pregnancy. Gestational hypertension usually goes away after you have your baby, but your health care provider will need to monitor your blood pressure to make sure that it is getting better. °· Preeclampsia. This is severe hypertension during pregnancy. This can cause serious complications for you and your baby and can also cause complications for you after the delivery of your baby. °· Postpartum preeclampsia. You may develop severe hypertension after giving birth. This usually occurs within 48 hours after childbirth but may occur up to 6 weeks after giving birth. This is rare. °How does this affect me? °Women who have hypertension during pregnancy have a greater chance of developing hypertension later in life or during future pregnancies. In some cases, hypertension during pregnancy can cause serious complications, such as: °· Stroke. °· Heart attack. °· Injury to  other organs, such as kidneys, lungs, or liver. °· Preeclampsia. °· Convulsions or seizures. °· Placental abruption. °How does this affect my baby? °Hypertension during pregnancy can affect your baby. Your baby may: °· Be born early (prematurely). °· Not weigh as much as he or she should at birth (low birth weight). °· Not tolerate labor well, leading to an unplanned cesarean delivery. °What are the risks? °There are certain factors that make it more likely for you to develop hypertension during pregnancy. These include: °· Having hypertension during a previous pregnancy. °· Being overweight. °· Being age 35 or older. °· Being pregnant for the first time. °· Being pregnant with more than one baby. °· Becoming pregnant using fertilization methods, such as IVF (in vitro fertilization). °· Having other medical problems, such as diabetes, kidney disease, or lupus. °· Having a family history of hypertension. °What can I do to lower my risk? °The exact cause of hypertension during pregnancy is not known. You may be able to lower your risk by: °· Maintaining a healthy weight. °· Eating a healthy and balanced diet. °· Following your health care provider's instructions about treating any long-term conditions that you had before becoming pregnant. °It is very important to keep all of your prenatal care appointments. Your health care provider will check your blood pressure and make sure that your pregnancy is progressing as expected. If a problem is found, early treatment can prevent complications. °How is this treated? °Treatment for hypertension during pregnancy varies depending on the type of hypertension you have and how serious it is. °· If you were taking medicine for high blood pressure before you became pregnant, talk with your health care provider. You may need to change medicine during pregnancy because   some medicines, like ACE inhibitors, may not be considered safe for your baby. °· If you have gestational  hypertension, your health care provider may order medicine to treat this during pregnancy. °· If you are at risk for preeclampsia, your health care provider may recommend that you take a low-dose aspirin during your pregnancy. °· If you have severe hypertension, you may need to be hospitalized so you and your baby can be monitored closely. You may also need to be given medicine to lower your blood pressure. This medicine may be given by mouth or through an IV. °· In some cases, if your condition gets worse, you may need to deliver your baby early. °Follow these instructions at home: °Eating and drinking ° °· Drink enough fluid to keep your urine pale yellow. °· Avoid caffeine. °Lifestyle °· Do not use any products that contain nicotine or tobacco, such as cigarettes, e-cigarettes, and chewing tobacco. If you need help quitting, ask your health care provider. °· Do not use alcohol or drugs. °· Avoid stress as much as possible. °· Rest and get plenty of sleep. °· Regular exercise can help to reduce your blood pressure. Ask your health care provider what kinds of exercise are best for you. °General instructions °· Take over-the-counter and prescription medicines only as told by your health care provider. °· Keep all prenatal and follow-up visits as told by your health care provider. This is important. °Contact a health care provider if: °· You have symptoms that your health care provider told you may require more treatment or monitoring, such as: °? Headaches. °? Nausea or vomiting. °? Abdominal pain. °? Dizziness. °? Light-headedness. °Get help right away if: °· You have: °? Severe abdominal pain that does not get better with treatment. °? A severe headache that does not get better. °? Vomiting that does not get better. °? Sudden, rapid weight gain. °? Sudden swelling in your hands, ankles, or face. °? Vaginal bleeding. °? Blood in your urine. °? Blurred or double vision. °? Shortness of breath or chest  pain. °? Weakness on one side of your body. °? Difficulty speaking. °· Your baby is not moving as much as usual. °Summary °· High blood pressure (hypertension) is when the force of blood pumping through the arteries is too strong. °· Hypertension during pregnancy can cause problems for you and your baby. °· Treatment for hypertension during pregnancy varies depending on the type of hypertension you have and how serious it is. °· Keep all prenatal and follow-up visits as told by your health care provider. This is important. °This information is not intended to replace advice given to you by your health care provider. Make sure you discuss any questions you have with your health care provider. °Document Released: 08/19/2011 Document Revised: 03/24/2019 Document Reviewed: 12/28/2018 °Elsevier Patient Education © 2020 [employer] ° °

## 2019-12-07 ENCOUNTER — Ambulatory Visit (INDEPENDENT_AMBULATORY_CARE_PROVIDER_SITE_OTHER): Payer: Medicaid Other | Admitting: Nurse Practitioner

## 2019-12-07 ENCOUNTER — Other Ambulatory Visit: Payer: Self-pay

## 2019-12-07 ENCOUNTER — Encounter: Payer: Self-pay | Admitting: Obstetrics and Gynecology

## 2019-12-07 VITALS — BP 113/70 | HR 92 | Temp 98.1°F | Wt 242.0 lb

## 2019-12-07 DIAGNOSIS — Z3A39 39 weeks gestation of pregnancy: Secondary | ICD-10-CM

## 2019-12-07 DIAGNOSIS — Z3480 Encounter for supervision of other normal pregnancy, unspecified trimester: Secondary | ICD-10-CM

## 2019-12-07 DIAGNOSIS — Z98891 History of uterine scar from previous surgery: Secondary | ICD-10-CM

## 2019-12-07 DIAGNOSIS — O26843 Uterine size-date discrepancy, third trimester: Secondary | ICD-10-CM

## 2019-12-07 DIAGNOSIS — O99213 Obesity complicating pregnancy, third trimester: Secondary | ICD-10-CM

## 2019-12-07 HISTORY — DX: Obesity complicating pregnancy, third trimester: O99.213

## 2019-12-07 NOTE — Patient Instructions (Signed)

## 2019-12-07 NOTE — Progress Notes (Signed)
   MY CHART VIDEO VIRTUAL OBSTETRICS VISIT ENCOUNTER NOTE  I connected with Julia Rollins on 12/01/19 at  2:10 PM EST by My Chart video at home and verified that I am speaking with the correct person using two identifiers.   I discussed the limitations, risks, security and privacy concerns of performing an evaluation and management service by My Chart video and the availability of in person appointments. I also discussed with the patient that there may be a patient responsible charge related to this service. The patient expressed understanding and agreed to proceed.  Subjective:  Julia Rollins is a 28 y.o. G2P1001 at [redacted]w[redacted]d being followed for ongoing prenatal care.  She is currently monitored for the following issues for this low-risk pregnancy and has History of marijuana use; History of cesarean section; Supervision of other normal pregnancy, antepartum; and Previous cesarean delivery affecting pregnancy on their problem list.  Patient reports no complaints. Reports fetal movement. Denies any contractions, bleeding or leaking of fluid.   The following portions of the patient's history were reviewed and updated as appropriate: allergies, current medications, past family history, past medical history, past social history, past surgical history and problem list.   Objective:   General:  Alert, oriented and cooperative.   Mental Status: Normal mood and affect perceived. Normal judgment and thought content.  Rest of physical exam deferred due to type of encounter  BP 114/79   LMP 03/06/2019 (Exact Date)  **Done by patient's own at home BP cuff. Patient does not have a scale at home  Assessment and Plan:  Pregnancy: G2P1001 at [redacted]w[redacted]d  1. Previous cesarean delivery affecting pregnancy - Plans to [facility] -- consent was signed at 28 wks  2. Supervision of other normal pregnancy, antepartum - Anticipatory guidance for next week's visit to be in-office - Plan cervical check at next visit  Term  labor symptoms and general obstetric precautions including but not limited to vaginal bleeding, contractions, leaking of fluid and fetal movement were reviewed in detail with the patient.  I discussed the assessment and treatment plan with the patient. The patient was provided an opportunity to ask questions and all were answered. The patient agreed with the plan and demonstrated an understanding of the instructions. The patient was advised to call back or seek an in-person office evaluation/go to [facility] at [facility] for any urgent or concerning symptoms. Please refer to After Visit Summary for other counseling recommendations.   I provided 5 minutes of non-face-to-face time during this encounter. There was 5 minutes of chart review time spent prior to this encounter. Total time spent = 10 minutes.  No follow-ups on file.  Future Appointments  Date Time Provider [place]  12/07/2019  1:10 PM [place], Rona Ravens, NP [facility] None    Laury Deep, [place] for [employer], [place]

## 2019-12-07 NOTE — Progress Notes (Signed)
    Subjective:  Julia Rollins is a 28 y.o. G2P1001 at [redacted]w[redacted]d being seen today for ongoing prenatal care.  She is currently monitored for the following issues for this low-risk pregnancy and has History of marijuana use; History of cesarean section; Supervision of other normal pregnancy, antepartum; Previous cesarean delivery affecting pregnancy; and Obesity in pregnancy, antepartum, third trimester on their problem list.  Patient reports periodic contractions at night.  Contractions: Irregular. Vag. Bleeding: None.  Movement: Present. Denies leaking of fluid.   The following portions of the patient's history were reviewed and updated as appropriate: allergies, current medications, past family history, past medical history, past social history, past surgical history and problem list. Problem list updated.  Objective:   Vitals:   12/07/19 1351  BP: 113/70  Pulse: 92  Temp: 98.1 F (36.7 C)  Weight: 242 lb (109.8 kg)    Fetal Status: Fetal Heart Rate (bpm): 134 Fundal Height: 43 cm Movement: Present  Presentation: Vertex  General:  Alert, oriented and cooperative. Patient is in no acute distress.  Skin: Skin is warm and dry. No rash noted.   Cardiovascular: Normal heart rate noted  Respiratory: Normal respiratory effort, no problems with respiration noted  Abdomen: Soft, gravid, appropriate for gestational age. Pain/Pressure: Present     Pelvic:  Cervical exam performed Dilation: Closed Effacement (%): Thick    Extremities: Normal range of motion.  Edema: None  Mental Status: Normal mood and affect. Normal behavior. Normal judgment and thought content.   Urinalysis:      Assessment and Plan:  Pregnancy: G2P1001 at [redacted]w[redacted]d 1.  Supervision of other normal pregnancy Client now [redacted]w[redacted]d  Usually would schedule induction now for [redacted]w[redacted]d but client does not want to schedule induction.  States she did go into labor on her own last time before induction.  Will plan for inperson visit next time.   Advised that induction would be a gentle nudge to get her to have consistent labor contractions and cervical but client declines to be scheduled - wants to use home remedies to start labor.  2. Uterine size date discrepancy pregnancy, third trimester Will schedule [place] and BPP next week - holidays the rest of this week.  - [place] MFM OB FOLLOW UP; Future - US FETAL BPP W/NONSTRESS; Future  3.  History of Cesarean Section Can schedule for induction with low dose pitocin but client does not consent to be scheduled. Will review again in one week. Client anticipates going into labor without a need for induction since she did not need one with her last pregnancy. Tearful after exam due to not being in labor.  Term labor symptoms and general obstetric precautions including but not limited to vaginal bleeding, contractions, leaking of fluid and fetal movement were reviewed in detail with the patient. Please refer to After Visit Summary for other counseling recommendations.  Return in about 6 days (around 12/13/2019) for terrm ROB, in person.  Earlie Server, RN, MSN, NP-BC Nurse Practitioner, [facility] for [employer], [place] 12/07/2019 2:59 PM

## 2019-12-13 ENCOUNTER — Other Ambulatory Visit: Payer: Self-pay | Admitting: Nurse Practitioner

## 2019-12-13 ENCOUNTER — Ambulatory Visit (HOSPITAL_COMMUNITY): Payer: Medicaid Other

## 2019-12-13 ENCOUNTER — Encounter (HOSPITAL_COMMUNITY): Payer: Self-pay

## 2019-12-13 ENCOUNTER — Other Ambulatory Visit: Payer: Self-pay

## 2019-12-13 ENCOUNTER — Ambulatory Visit (HOSPITAL_COMMUNITY)
Admission: RE | Admit: 2019-12-13 | Discharge: 2019-12-13 | Disposition: A | Discharge status: Home / Self Care | Payer: Medicaid Other | Source: Ambulatory Visit | Attending: Nurse Practitioner | Admitting: Nurse Practitioner

## 2019-12-13 ENCOUNTER — Encounter: Payer: Self-pay | Admitting: Nurse Practitioner

## 2019-12-13 ENCOUNTER — Ambulatory Visit (HOSPITAL_COMMUNITY): Payer: Medicaid Other | Admitting: *Deleted

## 2019-12-13 DIAGNOSIS — Z3A40 40 weeks gestation of pregnancy: Secondary | ICD-10-CM | POA: Diagnosis not present

## 2019-12-13 DIAGNOSIS — Z362 Encounter for other antenatal screening follow-up: Secondary | ICD-10-CM

## 2019-12-13 DIAGNOSIS — Z3480 Encounter for supervision of other normal pregnancy, unspecified trimester: Secondary | ICD-10-CM | POA: Diagnosis present

## 2019-12-13 DIAGNOSIS — O34219 Maternal care for unspecified type scar from previous cesarean delivery: Secondary | ICD-10-CM | POA: Diagnosis present

## 2019-12-13 DIAGNOSIS — O26843 Uterine size-date discrepancy, third trimester: Secondary | ICD-10-CM

## 2019-12-13 DIAGNOSIS — O480 Post-term pregnancy: Secondary | ICD-10-CM

## 2019-12-13 DIAGNOSIS — O3660X0 Maternal care for excessive fetal growth, unspecified trimester, not applicable or unspecified: Secondary | ICD-10-CM

## 2019-12-13 HISTORY — DX: Maternal care for excessive fetal growth, unspecified trimester, not applicable or unspecified: O36.60X0

## 2019-12-14 ENCOUNTER — Encounter (HOSPITAL_COMMUNITY): Payer: Self-pay | Admitting: Obstetrics and Gynecology

## 2019-12-14 ENCOUNTER — Telehealth: Payer: Self-pay | Admitting: General Practice

## 2019-12-14 ENCOUNTER — Inpatient Hospital Stay (EMERGENCY_DEPARTMENT_HOSPITAL)
Admission: AD | Admit: 2019-12-14 | Discharge: 2019-12-14 | Disposition: A | Discharge status: Home / Self Care | Payer: Medicaid Other | Source: Home / Self Care | Attending: Obstetrics and Gynecology | Admitting: Obstetrics and Gynecology

## 2019-12-14 ENCOUNTER — Inpatient Hospital Stay (EMERGENCY_DEPARTMENT_HOSPITAL)
Admission: AD | Admit: 2019-12-14 | Discharge: 2019-12-15 | Disposition: A | Discharge status: Home / Self Care | Payer: Medicaid Other | Source: Home / Self Care | Attending: Obstetrics and Gynecology | Admitting: Obstetrics and Gynecology

## 2019-12-14 ENCOUNTER — Other Ambulatory Visit: Payer: Self-pay | Admitting: Obstetrics and Gynecology

## 2019-12-14 ENCOUNTER — Other Ambulatory Visit: Payer: Self-pay

## 2019-12-14 ENCOUNTER — Encounter: Payer: Medicaid Other | Admitting: Advanced Practice Midwife

## 2019-12-14 DIAGNOSIS — O163 Unspecified maternal hypertension, third trimester: Secondary | ICD-10-CM | POA: Insufficient documentation

## 2019-12-14 DIAGNOSIS — Z3A40 40 weeks gestation of pregnancy: Secondary | ICD-10-CM

## 2019-12-14 DIAGNOSIS — O34219 Maternal care for unspecified type scar from previous cesarean delivery: Secondary | ICD-10-CM | POA: Insufficient documentation

## 2019-12-14 DIAGNOSIS — Z3689 Encounter for other specified antenatal screening: Secondary | ICD-10-CM

## 2019-12-14 DIAGNOSIS — O471 False labor at or after 37 completed weeks of gestation: Secondary | ICD-10-CM | POA: Insufficient documentation

## 2019-12-14 DIAGNOSIS — O480 Post-term pregnancy: Secondary | ICD-10-CM | POA: Insufficient documentation

## 2019-12-14 LAB — POCT FERN TEST: POCT Fern Test: NEGATIVE

## 2019-12-14 MED ORDER — BUTORPHANOL TARTRATE 1 MG/ML IJ SOLN
1.0000 mg | Freq: Once | INTRAMUSCULAR | Status: AC
  Administered 2019-12-14: 1 mg via INTRAMUSCULAR
  Filled 2019-12-14: qty 1

## 2019-12-14 MED ORDER — PROMETHAZINE HCL 25 MG/ML IJ SOLN
12.5000 mg | Freq: Once | INTRAMUSCULAR | Status: AC
  Administered 2019-12-14: 12.5 mg via INTRAMUSCULAR
  Filled 2019-12-14: qty 1

## 2019-12-14 NOTE — MAU Note (Signed)
Pt states she checked her blood pressure at home and it was 139/81. Pt states she is unsure if she was taking her blood pressure correctly.    Pt states that she is unsure if her water broke or not.   Denies vaginal bleeding  Reports +fm   Pt reports ctx's getting stronger than her visit from earlier

## 2019-12-14 NOTE — Progress Notes (Signed)
  S: Ms. Julia Rollins is a 28 y.o. G2P1001 at [redacted]w[redacted]d  who presents to MAU today complaining contractions q 10 minutes since three days. She denies vaginal bleeding. She denies LOF. She reports normal fetal movement.    O: BP 121/76   Pulse 99   Temp (!) 97.3 F (36.3 C)   Resp 16   Ht 5\' 6"  (1.676 m)   Wt 112.9 kg   LMP 03/06/2019 (Exact Date)   SpO2 100%   BMI 40.19 kg/m  GENERAL: Well-developed, well-nourished female in no acute distress.  HEAD: Normocephalic, atraumatic.  CHEST: Normal effort of breathing, regular heart rate ABDOMEN: Soft, nontender, gravid  Cervical exam:  Dilation: 2.5 Effacement (%): 80 Cervical Position: Posterior Station: -2 Presentation: Vertex Exam by:: F. Morris, RNC   Fetal Monitoring: Baseline: 125 Variability: moderate Accelerations: present, 15 x 15 Decelerations: absent Contractions: irregular, q10-15 minutes   A: SIUP at [redacted]w[redacted]d  False labor  P: DC to home with return precautions  Yzabella Crunk L, DO 12/14/2019 12:50 PM

## 2019-12-14 NOTE — Telephone Encounter (Signed)
Pt called and stated that she is having contractions and wanted to know if she could take Tylenol.  Consulted with Heather Hogan,CNM that it was ok take.  Also, provider recommended that pt go to MAU.  Pt informed of recommendation and verbalized understanding.  Appointment cancelled for today.

## 2019-12-14 NOTE — MAU Note (Signed)
I have communicated with Dr. Darene Lamer and reviewed vital signs:  Vitals:   12/14/19 1105 12/14/19 1256  BP: 121/76 124/75  Pulse: 99 91  Resp: 16 20  Temp: (!) 97.3 F (36.3 C) 97.9 F (36.6 C)  SpO2: 100% 100%    Vaginal exam:  Dilation: 2.5 Effacement (%): 80 Cervical Position: Posterior Station: -2 Presentation: Vertex Exam by:: F. Jeryl Umholtz, RNC,   Also reviewed contraction pattern and that non-stress test is reactive.  It has been documented that patient is contracting irregularly with no cervical change over 1.5 hours not indicating active labor.  Patient denies any other complaints.  Based on this report provider has given order for discharge.  A discharge order and diagnosis entered by a provider.   Labor discharge instructions reviewed with patient.

## 2019-12-14 NOTE — MAU Note (Signed)
.   Julia Rollins is a 28 y.o. at [redacted]w[redacted]d here in MAU reporting: contractions for a couple of days getting stronger this morning around 5. Denies any vaginal bleeding or LOF  Onset of complaint: 5am Pain score: 8 Vitals:   12/14/19 1105  BP: 121/76  Pulse: 99  Resp: 16  Temp: (!) 97.3 F (36.3 C)  SpO2: 100%     FHT:135 Lab orders placed from triage:

## 2019-12-14 NOTE — Discharge Instructions (Signed)

## 2019-12-14 NOTE — MAU Provider Note (Signed)
History     CSN: [id number]  Arrival date and time: 12/14/19 2211   First Provider Initiated Contact with Patient 12/14/19 2255      Chief Complaint  Patient presents with  . Hypertension  . Contractions   HPI Julia Rollins is a 28 y.o. G2P1001 at 35w3dwho presents to MAU with multiple complaints. She denies vaginal bleeding, leaking of fluid, decreased fetal movement, fever, falls, or recent illness.   Labor contractions This is a recurrent problem. Patient was evaluated in MAU for labor earlier today. She states have contractions have continued to increase in intensity since that time. She rates her bilateral lower abdominal contractions as 9/10. Her pain does not radiate. She denies aggravating or alleviating factors.   Concern for abnormal discharge Patient endorses one long strand of vaginal discharge which she thinks may signify ROM "but I'm not sure it wasn't urine". She denies gush of fluid and ongoing leaking of fluid.   Hypertension Patient endorses elevated blood pressures of 130s/80s on her home cuff. She denies headache, visual disturbances, RUQ/epigastric pain, new onset swelling or weight gain.  Patient desires TOLAC. She declines to consider admission for postdates or schedule IOL.  OB History    Gravida  2   Para  1   Term  1   Preterm      AB      Living  1     SAB      TAB      Ectopic      Multiple      Live Births  1           Past Medical History:  Diagnosis Date  . Medical history non-contributory     Past Surgical History:  Procedure Laterality Date  . CESAREAN SECTION N/A 10/09/2017   Procedure: CESAREAN SECTION;  Surgeon: EHarlin Heys MD;  Location: [facility] ORS;  Service: Obstetrics;  Laterality: N/A;  . HERNIA REPAIR     89or 28years old    No family history on file.  Social History   Tobacco Use  . Smoking status: Never Smoker  . Smokeless tobacco: Never Used  Substance Use Topics  . Alcohol use: No  .  Drug use: Not Currently    Types: Marijuana    Comment: Marijuana    Allergies: No Known Allergies  Medications Prior to Admission  Medication Sig Dispense Refill Last Dose  . Blood Pressure Monitoring (BLOOD PRESSURE MONITOR AUTOMAT) DEVI 1 kit by Does not apply route daily. Diagnosis O09.90. Automatic blood pressure cuff with regular size cuff. Blood pressure to be monitored regularly at home. (Patient not taking: Reported on 12/13/2019) 1 kit 0   . fluconazole (DIFLUCAN) 150 MG tablet Take 1 tablet (150 mg total) by mouth daily. (Patient not taking: Reported on 12/13/2019) 1 tablet 0   . metroNIDAZOLE (FLAGYL) 500 MG tablet Take 1 tablet (500 mg total) by mouth 2 (two) times daily. (Patient not taking: Reported on 07/20/2019) 14 tablet 0   . nitrofurantoin, macrocrystal-monohydrate, (MACROBID) 100 MG capsule Take 1 capsule (100 mg total) by mouth 2 (two) times daily. (Patient not taking: Reported on 10/12/2019) 14 capsule 0   . Prenatal Vit-Fe Phos-FA-Omega (VITAFOL GUMMIES) 3.33-0.333-34.8 MG CHEW Chew 3 each by mouth daily. (Patient not taking: Reported on 12/13/2019) 90 tablet 12   . terconazole (TERAZOL 7) 0.4 % vaginal cream Place 1 applicator vaginally at bedtime. (Patient not taking: Reported on 07/20/2019) 45 g 0  Review of Systems  Constitutional: Negative for chills, fatigue and fever.  Eyes: Negative for visual disturbance.  Respiratory: Negative for shortness of breath.   Cardiovascular: Negative for chest pain, palpitations and leg swelling.  Gastrointestinal: Positive for abdominal pain.  Genitourinary: Positive for vaginal discharge. Negative for vaginal bleeding and vaginal pain.  Musculoskeletal: Negative for back pain.  Neurological: Negative for headaches.  All other systems reviewed and are negative.  Physical Exam   Blood pressure 129/72, pulse 89, temperature 98.5 F (36.9 C), resp. rate 20, last menstrual period 03/06/2019, SpO2 99 %.  Physical Exam  Nursing  note and vitals reviewed. Constitutional: She is oriented to person, place, and time. She appears well-developed and well-nourished.  Cardiovascular: Normal rate.  Respiratory: Effort normal and breath sounds normal.  GI:  Gravid  Genitourinary:    Vagina and uterus normal.   Neurological: She is alert and oriented to person, place, and time.  Skin: Skin is warm and dry.  Psychiatric: She has a normal mood and affect. Her behavior is normal. Judgment and thought content normal.    MAU Course/MDM  Procedures  --Negative pooling, negative fern --Cervix 2.5 this morning, 3cm in MAU --Reactive tracing: baseline 125, mod var, + accels, no decels --Toco: irregular mild contractions --BPP 8/8 on 12/13/19  Patient Vitals for the past 24 hrs:  BP Temp Pulse Resp SpO2  12/15/19 0016 136/83 -- 92 -- 99 %  12/15/19 0015 136/83 -- 89 -- --  12/14/19 2346 130/76 -- 92 -- 98 %  12/14/19 2331 123/75 -- 91 -- 99 %  12/14/19 2316 124/73 -- 90 -- 99 %  12/14/19 2246 129/72 -- 89 -- 99 %  12/14/19 2233 132/81 -- 90 -- --  12/14/19 2232 132/81 98.5 F (36.9 C) 96 20 100 %   Results for orders placed or performed during the hospital encounter of 12/14/19 (from the past 24 hour(s))  Fern Test     Status: None   Collection Time: 12/14/19 11:25 PM  Result Value Ref Range   POCT Fern Test Negative = intact amniotic membranes     Assessment and Plan  --28 y.o. G2P1001 at [redacted]w[redacted]d --Reactive tracing --Normotensive --Intact amniotic sac --Declines IOL for postdates --Discharge home in stable condition with labor precautions  [facility]  SDarlina Rumpf CAppleton12/31/2020, 1:51 AM

## 2019-12-14 NOTE — Progress Notes (Signed)
Pt informed office will be calling regarding office appt tomorrow.  Pt verbalized understanding & agreeable.

## 2019-12-14 NOTE — Progress Notes (Signed)
Dr. Darene Lamer attending delivery, will call RN back for status report regarding pt.

## 2019-12-14 NOTE — Progress Notes (Signed)
This RN informed Dr. Darene Lamer that pt doesn't have a f/u appt scheduled and is 40+[redacted] weeks gestation with a hx of previous Cesarean.  Dr. Darene Lamer states will send office message to notify pt regarding appt tomorrow for BPP.

## 2019-12-15 ENCOUNTER — Encounter (HOSPITAL_COMMUNITY): Payer: Self-pay | Admitting: Obstetrics and Gynecology

## 2019-12-15 ENCOUNTER — Inpatient Hospital Stay (HOSPITAL_COMMUNITY): Payer: Medicaid Other | Admitting: Anesthesiology

## 2019-12-15 ENCOUNTER — Inpatient Hospital Stay (HOSPITAL_COMMUNITY)
Admission: AD | Admit: 2019-12-15 | Discharge: 2019-12-17 | DRG: 807 | Disposition: A | Discharge status: Home / Self Care | Payer: Medicaid Other | Attending: Obstetrics and Gynecology | Admitting: Obstetrics and Gynecology

## 2019-12-15 DIAGNOSIS — Z3689 Encounter for other specified antenatal screening: Secondary | ICD-10-CM | POA: Diagnosis not present

## 2019-12-15 DIAGNOSIS — R739 Hyperglycemia, unspecified: Secondary | ICD-10-CM | POA: Diagnosis present

## 2019-12-15 DIAGNOSIS — Z20822 Contact with and (suspected) exposure to covid-19: Secondary | ICD-10-CM | POA: Diagnosis present

## 2019-12-15 DIAGNOSIS — O99214 Obesity complicating childbirth: Secondary | ICD-10-CM | POA: Diagnosis present

## 2019-12-15 DIAGNOSIS — Z3A40 40 weeks gestation of pregnancy: Secondary | ICD-10-CM

## 2019-12-15 DIAGNOSIS — O34219 Maternal care for unspecified type scar from previous cesarean delivery: Secondary | ICD-10-CM | POA: Diagnosis present

## 2019-12-15 DIAGNOSIS — O99810 Abnormal glucose complicating pregnancy: Secondary | ICD-10-CM

## 2019-12-15 DIAGNOSIS — O3660X0 Maternal care for excessive fetal growth, unspecified trimester, not applicable or unspecified: Secondary | ICD-10-CM | POA: Diagnosis present

## 2019-12-15 DIAGNOSIS — O99213 Obesity complicating pregnancy, third trimester: Secondary | ICD-10-CM | POA: Diagnosis present

## 2019-12-15 DIAGNOSIS — O26893 Other specified pregnancy related conditions, third trimester: Secondary | ICD-10-CM | POA: Diagnosis present

## 2019-12-15 DIAGNOSIS — O3663X0 Maternal care for excessive fetal growth, third trimester, not applicable or unspecified: Secondary | ICD-10-CM | POA: Diagnosis present

## 2019-12-15 DIAGNOSIS — O480 Post-term pregnancy: Secondary | ICD-10-CM

## 2019-12-15 DIAGNOSIS — Z98891 History of uterine scar from previous surgery: Secondary | ICD-10-CM

## 2019-12-15 DIAGNOSIS — Z3480 Encounter for supervision of other normal pregnancy, unspecified trimester: Secondary | ICD-10-CM

## 2019-12-15 DIAGNOSIS — O471 False labor at or after 37 completed weeks of gestation: Secondary | ICD-10-CM | POA: Diagnosis not present

## 2019-12-15 LAB — RESPIRATORY PANEL BY RT PCR (FLU A&B, COVID)
Influenza A by PCR: NEGATIVE
Influenza B by PCR: NEGATIVE
SARS Coronavirus 2 by RT PCR: NEGATIVE

## 2019-12-15 LAB — TYPE AND SCREEN
ABO/RH(D): O POS
Antibody Screen: NEGATIVE

## 2019-12-15 LAB — ABO/RH: ABO/RH(D): O POS

## 2019-12-15 LAB — RPR: RPR Ser Ql: NONREACTIVE

## 2019-12-15 LAB — CBC
HCT: 34.5 % — ABNORMAL LOW (ref 36.0–46.0)
Hemoglobin: 11 g/dL — ABNORMAL LOW (ref 12.0–15.0)
MCH: 26.1 pg (ref 26.0–34.0)
MCHC: 31.9 g/dL (ref 30.0–36.0)
MCV: 81.9 fL (ref 80.0–100.0)
Platelets: 162 10*3/uL (ref 150–400)
RBC: 4.21 MIL/uL (ref 3.87–5.11)
RDW: 15.7 % — ABNORMAL HIGH (ref 11.5–15.5)
WBC: 10.8 10*3/uL — ABNORMAL HIGH (ref 4.0–10.5)
nRBC: 0 % (ref 0.0–0.2)

## 2019-12-15 LAB — POCT FERN TEST: POCT Fern Test: POSITIVE

## 2019-12-15 MED ORDER — PRENATAL MULTIVITAMIN CH
1.0000 | ORAL_TABLET | Freq: Every day | ORAL | Status: DC
  Administered 2019-12-16 – 2019-12-17 (×2): 1 via ORAL
  Filled 2019-12-15 (×2): qty 1

## 2019-12-15 MED ORDER — DIPHENHYDRAMINE HCL 50 MG/ML IJ SOLN: 12.5000 mg | INTRAMUSCULAR | Status: DC | PRN

## 2019-12-15 MED ORDER — DIPHENHYDRAMINE HCL 25 MG PO CAPS: 25.0000 mg | ORAL_CAPSULE | Freq: Four times a day (QID) | ORAL | Status: DC | PRN

## 2019-12-15 MED ORDER — ONDANSETRON HCL 4 MG/2ML IJ SOLN
4.0000 mg | Freq: Four times a day (QID) | INTRAMUSCULAR | Status: DC | PRN
  Administered 2019-12-15: 4 mg via INTRAVENOUS
  Filled 2019-12-15: qty 2

## 2019-12-15 MED ORDER — FENTANYL CITRATE (PF) 100 MCG/2ML IJ SOLN
50.0000 ug | Freq: Once | INTRAMUSCULAR | Status: AC
  Administered 2019-12-15: 50 ug via INTRAVENOUS

## 2019-12-15 MED ORDER — LACTATED RINGERS IV SOLN: INTRAVENOUS | Status: DC

## 2019-12-15 MED ORDER — LACTATED RINGERS IV SOLN
500.0000 mL | INTRAVENOUS | Status: DC | PRN
  Administered 2019-12-15: 1000 mL via INTRAVENOUS

## 2019-12-15 MED ORDER — OXYCODONE-ACETAMINOPHEN 5-325 MG PO TABS
2.0000 | ORAL_TABLET | ORAL | Status: DC | PRN
  Administered 2019-12-16 – 2019-12-17 (×7): 2 via ORAL
  Filled 2019-12-15 (×7): qty 2

## 2019-12-15 MED ORDER — OXYTOCIN 40 UNITS IN NORMAL SALINE INFUSION - SIMPLE MED
1.0000 m[IU]/min | INTRAVENOUS | Status: DC
  Administered 2019-12-15: 2 m[IU]/min via INTRAVENOUS

## 2019-12-15 MED ORDER — LIDOCAINE HCL (PF) 1 % IJ SOLN
30.0000 mL | INTRAMUSCULAR | Status: AC | PRN
  Administered 2019-12-15: 30 mL via SUBCUTANEOUS
  Filled 2019-12-15: qty 30

## 2019-12-15 MED ORDER — LACTATED RINGERS AMNIOINFUSION: INTRAVENOUS | Status: DC

## 2019-12-15 MED ORDER — OXYTOCIN 40 UNITS IN NORMAL SALINE INFUSION - SIMPLE MED
2.5000 [IU]/h | INTRAVENOUS | Status: DC
  Filled 2019-12-15: qty 1000

## 2019-12-15 MED ORDER — COCONUT OIL OIL: 1.0000 "application " | TOPICAL_OIL | Status: DC | PRN

## 2019-12-15 MED ORDER — OXYCODONE-ACETAMINOPHEN 5-325 MG PO TABS
1.0000 | ORAL_TABLET | ORAL | Status: DC | PRN
  Administered 2019-12-15: 1 via ORAL
  Filled 2019-12-15: qty 1

## 2019-12-15 MED ORDER — WITCH HAZEL-GLYCERIN EX PADS: 1.0000 "application " | MEDICATED_PAD | CUTANEOUS | Status: DC | PRN

## 2019-12-15 MED ORDER — EPHEDRINE 5 MG/ML INJ: 10.0000 mg | INTRAVENOUS | Status: DC | PRN

## 2019-12-15 MED ORDER — MISOPROSTOL 200 MCG PO TABS
ORAL_TABLET | ORAL | Status: AC
  Filled 2019-12-15: qty 5

## 2019-12-15 MED ORDER — SODIUM CHLORIDE (PF) 0.9 % IJ SOLN
INTRAMUSCULAR | Status: DC | PRN
  Administered 2019-12-15: 12 mL/h via EPIDURAL

## 2019-12-15 MED ORDER — LACTATED RINGERS IV SOLN: 500.0000 mL | Freq: Once | INTRAVENOUS | Status: DC

## 2019-12-15 MED ORDER — LIDOCAINE-EPINEPHRINE (PF) 2 %-1:200000 IJ SOLN
INTRAMUSCULAR | Status: DC | PRN
  Administered 2019-12-15 (×2): 2 mL via EPIDURAL

## 2019-12-15 MED ORDER — BUPIVACAINE HCL (PF) 0.25 % IJ SOLN
INTRAMUSCULAR | Status: DC | PRN
  Administered 2019-12-15 (×2): 10 mL via EPIDURAL

## 2019-12-15 MED ORDER — BENZOCAINE-MENTHOL 20-0.5 % EX AERO
1.0000 "application " | INHALATION_SPRAY | CUTANEOUS | Status: DC | PRN
  Administered 2019-12-15: 1 via TOPICAL
  Filled 2019-12-15 (×2): qty 56

## 2019-12-15 MED ORDER — PHENYLEPHRINE 40 MCG/ML (10ML) SYRINGE FOR IV PUSH (FOR BLOOD PRESSURE SUPPORT)
80.0000 ug | PREFILLED_SYRINGE | INTRAVENOUS | Status: DC | PRN
  Filled 2019-12-15: qty 10

## 2019-12-15 MED ORDER — SENNOSIDES-DOCUSATE SODIUM 8.6-50 MG PO TABS
2.0000 | ORAL_TABLET | ORAL | Status: DC
  Administered 2019-12-15 – 2019-12-17 (×2): 2 via ORAL
  Filled 2019-12-15 (×2): qty 2

## 2019-12-15 MED ORDER — ZOLPIDEM TARTRATE 5 MG PO TABS: 5.0000 mg | ORAL_TABLET | Freq: Every evening | ORAL | Status: DC | PRN

## 2019-12-15 MED ORDER — SOD CITRATE-CITRIC ACID 500-334 MG/5ML PO SOLN: 30.0000 mL | ORAL | Status: DC | PRN

## 2019-12-15 MED ORDER — SIMETHICONE 80 MG PO CHEW
80.0000 mg | CHEWABLE_TABLET | ORAL | Status: DC | PRN
  Administered 2019-12-16: 80 mg via ORAL
  Filled 2019-12-15 (×2): qty 1

## 2019-12-15 MED ORDER — ONDANSETRON HCL 4 MG/2ML IJ SOLN: 4.0000 mg | INTRAMUSCULAR | Status: DC | PRN

## 2019-12-15 MED ORDER — ONDANSETRON HCL 4 MG PO TABS: 4.0000 mg | ORAL_TABLET | ORAL | Status: DC | PRN

## 2019-12-15 MED ORDER — PARAGARD INTRAUTERINE COPPER IU IUD
INTRAUTERINE_SYSTEM | Freq: Once | INTRAUTERINE | Status: DC
  Filled 2019-12-15: qty 1

## 2019-12-15 MED ORDER — IBUPROFEN 600 MG PO TABS
600.0000 mg | ORAL_TABLET | Freq: Four times a day (QID) | ORAL | Status: DC
  Administered 2019-12-15 – 2019-12-17 (×7): 600 mg via ORAL
  Filled 2019-12-15 (×7): qty 1

## 2019-12-15 MED ORDER — ACETAMINOPHEN 325 MG PO TABS: 650.0000 mg | ORAL_TABLET | ORAL | Status: DC | PRN

## 2019-12-15 MED ORDER — DIBUCAINE (PERIANAL) 1 % EX OINT: 1.0000 "application " | TOPICAL_OINTMENT | CUTANEOUS | Status: DC | PRN

## 2019-12-15 MED ORDER — FENTANYL-BUPIVACAINE-NACL 0.5-0.125-0.9 MG/250ML-% EP SOLN
EPIDURAL | Status: AC
  Filled 2019-12-15: qty 250

## 2019-12-15 MED ORDER — TETANUS-DIPHTH-ACELL PERTUSSIS 5-2.5-18.5 LF-MCG/0.5 IM SUSP: 0.5000 mL | Freq: Once | INTRAMUSCULAR | Status: DC

## 2019-12-15 MED ORDER — PHENYLEPHRINE 40 MCG/ML (10ML) SYRINGE FOR IV PUSH (FOR BLOOD PRESSURE SUPPORT): 80.0000 ug | PREFILLED_SYRINGE | INTRAVENOUS | Status: DC | PRN

## 2019-12-15 MED ORDER — FENTANYL CITRATE (PF) 100 MCG/2ML IJ SOLN
INTRAMUSCULAR | Status: AC
  Filled 2019-12-15: qty 2

## 2019-12-15 MED ORDER — TERBUTALINE SULFATE 1 MG/ML IJ SOLN
0.2500 mg | Freq: Once | INTRAMUSCULAR | Status: AC | PRN
  Administered 2019-12-15: 0.25 mg via SUBCUTANEOUS
  Filled 2019-12-15: qty 1

## 2019-12-15 MED ORDER — OXYTOCIN BOLUS FROM INFUSION
500.0000 mL | Freq: Once | INTRAVENOUS | Status: AC
  Administered 2019-12-15: 500 mL via INTRAVENOUS

## 2019-12-15 MED ORDER — FENTANYL-BUPIVACAINE-NACL 0.5-0.125-0.9 MG/250ML-% EP SOLN: 12.0000 mL/h | EPIDURAL | Status: DC | PRN

## 2019-12-15 MED ORDER — MISOPROSTOL 200 MCG PO TABS
1000.0000 ug | ORAL_TABLET | Freq: Once | ORAL | Status: AC
  Administered 2019-12-15: 1000 ug via RECTAL

## 2019-12-15 NOTE — Progress Notes (Signed)
Julia Rollins is a 28 y.o. G2P1001 at [redacted]w[redacted]d admitted for SOL Subjective:  Feeling uncomfortable with contractions still. Epidural has not been working well. Anesthesia has been in to redo all the tubing as the tubing was clogged. Patient getting more comfortable, but still having some pain.   Objective: BP 135/81   Pulse (!) 101   Temp 98.5 F (36.9 C) (Oral)   Resp 18   Ht 5\' 6"  (1.676 m)   Wt 112 kg   LMP 03/06/2019 (Exact Date)   SpO2 98%   BMI 39.85 kg/m  No intake/output data recorded. No intake/output data recorded.  FHT:  FHR: 130 bpm, variability: moderate,  accelerations:  Present,  decelerations:  Absent UC:   regular, every 2-5 minutes SVE:   Dilation: 7 Effacement (%): 80 Station: -1, -2 Exam by:: Josalynn Johndrow CNM   AROM of forebag and IUPC placed.   Labs: Lab Results  Component Value Date   WBC 10.8 (H) 12/15/2019   HGB 11.0 (L) 12/15/2019   HCT 34.5 (L) 12/15/2019   MCV 81.9 12/15/2019   PLT 162 12/15/2019    Assessment / Plan: Spontaneous labor with arrest of dilation.  Labor: AROM and IUPC placed  Preeclampsia:  NA Fetal Wellbeing:  Category I Pain Control:  Epidural I/D:  n/a Anticipated MOD:  Guarded   Will see if contractions are adequate with IUPC in. Patient is currently on 57mu/min of pitocin with a good looking contraction pattern, but with no change if contractions are adequate will discuss c-section with Dr. Roselie Awkward.   Marcille Buffy DNP, CNM  12/15/19  3:27 PM

## 2019-12-15 NOTE — Anesthesia Procedure Notes (Signed)
Epidural Patient location during procedure: OB Start time: 12/15/2019 6:00 AM End time: 12/15/2019 6:20 AM  Staffing Anesthesiologist: Freddrick March, MD Performed: anesthesiologist   Preanesthetic Checklist Completed: patient identified, IV checked, risks and benefits discussed, monitors and equipment checked, pre-op evaluation and timeout performed  Epidural Patient position: sitting Prep: DuraPrep and site prepped and draped Patient monitoring: continuous pulse ox, blood pressure, heart rate and cardiac monitor Approach: midline Location: L3-L4 Injection technique: LOR air  Needle:  Needle type: Tuohy  Needle gauge: 17 G Needle length: 9 cm Needle insertion depth: 7 cm Catheter type: closed end flexible Catheter size: 19 Gauge Catheter at skin depth: 13 cm Test dose: negative  Assessment Sensory level: T8 Events: blood not aspirated, injection not painful, no injection resistance, no paresthesia and negative IV test  Additional Notes Patient identified. Risks/Benefits/Options discussed with patient including but not limited to bleeding, infection, nerve damage, paralysis, failed block, incomplete pain control, headache, blood pressure changes, nausea, vomiting, reactions to medication both or allergic, itching and postpartum back pain. Confirmed with bedside nurse the patient's most recent platelet count. Confirmed with patient that they are not currently taking any anticoagulation, have any bleeding history or any family history of bleeding disorders. Patient expressed understanding and wished to proceed. All questions were answered. Sterile technique was used throughout the entire procedure. Please see nursing notes for vital signs. Test dose was given through epidural catheter and negative prior to continuing to dose epidural or start infusion. Warning signs of high block given to the patient including shortness of breath, tingling/numbness in hands, complete motor block,  or any concerning symptoms with instructions to call for help. Patient was given instructions on fall risk and not to get out of bed. All questions and concerns addressed with instructions to call with any issues or inadequate analgesia.  Reason for block:procedure for pain

## 2019-12-15 NOTE — Discharge Instructions (Signed)
Vaginal Birth After Cesarean Delivery  Vaginal birth after cesarean delivery (VBAC) is giving birth vaginally after previously delivering a baby through a cesarean section (C-section). A VBAC may be a safe option for you, depending on your health and other factors. It is important to discuss VBAC with your health care provider early in your pregnancy so you can understand the risks, benefits, and options. Having these discussions early will give you time to make your birth plan. Who are the best candidates for VBAC? The best candidates for VBAC are women who:  Have had one or two prior cesarean deliveries, and the incision made during the delivery was horizontal (low transverse).  Do not have a vertical (classical) scar on their uterus.  Have not had a tear in the wall of their uterus (uterine rupture).  Plan to have more pregnancies. A VBAC is also more likely to be successful:  In women who have previously given birth vaginally.  When labor starts by itself (spontaneously) before the due date. What are the benefits of VBAC? The benefits of delivering your baby vaginally instead of by a cesarean delivery include:  A shorter hospital stay.  A faster recovery time.  Less pain.  Avoiding risks associated with major surgery, such as infection and blood clots.  Less blood loss and less need for donated blood (transfusions). What are the risks of VBAC? The main risk of attempting a VBAC is that it may fail, forcing your health care provider to deliver your baby by a C-section. Other risks are rare and include:  Tearing (rupture) of the scar from a past cesarean delivery.  Other risks associated with vaginal deliveries. If a repeat cesarean delivery is needed, the risks include:  Blood loss.  Infection.  Blood clot.  Damage to surrounding organs.  Removal of the uterus (hysterectomy), if it is damaged.  Placenta problems in future pregnancies. What else should I know  about my options? Delivering a baby through a VBAC is similar to having a normal spontaneous vaginal delivery. Therefore, it is safe:  To try with twins.  For your health care provider to try to turn the baby from a breech position (external cephalic version) during labor.  With epidural analgesia for pain relief. Consider where you would like to deliver your baby. VBAC should be attempted in facilities where an emergency cesarean delivery can be performed. VBAC is not recommended for home births. Any changes in your health or your baby's health during your pregnancy may make it necessary to change your initial decision about VBAC. Your health care provider may recommend that you do not attempt a VBAC if:  Your baby's suspected weight is 8.8 lb (4 kg) or more.  You have preeclampsia. This is a condition that causes high blood pressure along with other symptoms, such as swelling and headaches.  You will have VBAC less than 19 months after your cesarean delivery.  You are past your due date.  You need to have labor started (induced) because your cervix is not ready for labor (unfavorable). Where to find more information  American Pregnancy Association: americanpregnancy.org  American Congress of Obstetricians and Gynecologists: acog.org Summary  Vaginal birth after cesarean delivery (VBAC) is giving birth vaginally after previously delivering a baby through a cesarean section (C-section). A VBAC may be a safe option for you, depending on your health and other factors.  Discuss VBAC with your health care provider early in your pregnancy so you can understand the risks, benefits, options, and   have plenty of time to make your birth plan.  The main risk of attempting a VBAC is that it may fail, forcing your health care provider to deliver your baby by a C-section. Other risks are rare. This information is not intended to replace advice given to you by your health care provider. Make sure  you discuss any questions you have with your health care provider. Document Revised: 03/29/2019 Document Reviewed: 03/10/2017 Elsevier Patient Education  2020 Elsevier Inc.  

## 2019-12-15 NOTE — Discharge Summary (Signed)
Postpartum Discharge Summary  Date of Service updated 12/17/2019     Patient Name: Julia Rollins DOB: Jan 24, 1991 MRN: [id number]  Date of admission: 12/15/2019 Delivering Provider: Marcille Buffy D   Date of discharge: 12/17/2019  Admitting diagnosis: Labor and delivery, indication for care [O75.9] Intrauterine pregnancy: [redacted]w[redacted]d    Secondary diagnosis:  Active Problems:   History of cesarean section   Obesity in pregnancy, antepartum, third trimester   LGA (large for gestational age) fetus affecting management of mother   Labor and delivery, indication for care   Gestational hyperglycemia  Additional problems: None     Discharge diagnosis: Term Pregnancy Delivered and VBAC                                                                                                Post partum procedures:Unable to place paraguard IUD  Augmentation: AROM and Pitocin  Complications: None  Hospital course:  Onset of Labor With Vaginal Delivery     28y.o. yo G2P1001 at 460w4das admitted in Active Labor on 12/15/2019. Patient had an uncomplicated labor course as follows:  Membrane Rupture Time/Date: 2:44 PM ,12/15/2019   Intrapartum Procedures: Episiotomy: None [1]                                         Lacerations:  2nd degree [3];Perineal [11]  Patient had a delivery of a Viable infant. 12/15/2019  Information for the patient's newborn:  CrRosali, Augello0[[id number]]Delivery Method: VBAC, Spontaneous(Filed from Delivery Summary)     Pateint had an uncomplicated postpartum course.  She is ambulating, tolerating a regular diet, passing flatus, and urinating well. Patient is discharged home in stable condition on 12/17/19.  Delivery time: 5:40 PM    Magnesium Sulfate received: No BMZ received: No Rhophylac:N/A MMR:N/A Transfusion:No  Physical exam  Vitals:   12/16/19 0332 12/16/19 0740 12/16/19 2228 12/17/19 0642  BP: 122/90 106/71 123/76 114/67  Pulse: 86 97 100 (!) 104   Resp: _0 Temp: 98.4 F (36.9 C) 98.6 F (37 C) 98.5 F (36.9 C) 98.8 F (37.1 C)  TempSrc: Axillary Oral Oral Oral  SpO2: 99%   99%  Weight:      Height:       General: alert Lochia: appropriate Uterine Fundus: firm Incision: N/A DVT Evaluation: No evidence of DVT seen on physical exam. Labs: Lab Results  Component Value Date   WBC 10.8 (H) 12/15/2019   HGB 11.0 (L) 12/15/2019   HCT 34.5 (L) 12/15/2019   MCV 81.9 12/15/2019   PLT 162 12/15/2019   No flowsheet data found.  Discharge instruction: per After Visit Summary and "Baby and Me Booklet".  After visit meds:  Allergies as of 12/17/2019   No Known Allergies     Medication List    STOP taking these medications   fluconazole 150 MG tablet Commonly known as: Diflucan     TAKE these medications   Blood Pressure Monitor Automat DeKerrin Mo  1 kit by Does not apply route daily. Diagnosis O09.90. Automatic blood pressure cuff with regular size cuff. Blood pressure to be monitored regularly at home.   ibuprofen 800 MG tablet Commonly known as: IBU Take 1 tablet (800 mg total) by mouth every 8 (eight) hours as needed.   Vitafol Gummies 3.33-0.333-34.8 MG Chew Chew 3 each by mouth daily.       Diet: routine diet  Activity: Advance as tolerated. Pelvic rest for 6 weeks.   Outpatient follow up:4 weeks Follow up Appt:No future appointments. Follow up Visit: Follow-up Information    [facility] up.   Specialty: Obstetrics and Gynecology Why: Call the office and be seen within 4 weeks to sign for tubal consent form.  Contact information: [place] [address] [phone]           Please schedule this patient for Postpartum visit in: 4 weeks with the following provider: Any provider For C/S patients schedule nurse incision check in weeks 2 weeks: no Low risk pregnancy complicated by: none Delivery mode:  SVD Anticipated Birth  Control:  IUD PP Procedures needed: none  Schedule Integrated BH visit: no      Newborn Data: Live born female  Birth Weight: 8 lb 6 oz (3800 g) APGAR: ,   Newborn Delivery   Birth date/time: 12/15/2019 17:40:00 Delivery type:       Baby Feeding: Bottle Disposition:home with mother    Lezlie Lye, NP 12/17/2019 11:23 AM

## 2019-12-15 NOTE — Progress Notes (Signed)
Labor Progress Note Julia Rollins is a 28 y.o. G2P1001 at [redacted]w[redacted]d presented for SOL/SROM. S: More comfortable with epidural.   O:  BP 113/67   Pulse 100   Temp 98.2 F (36.8 C) (Oral)   Resp 16   Ht 5\' 6"  (1.676 m)   Wt 112 kg   LMP 03/06/2019 (Exact Date)   SpO2 98%   BMI 39.85 kg/m  EFM: 145, moderate variability, pos accels, few lates after epidural, reactive TOCO: q2-50m  CVE: Dilation: 8 Effacement (%): 90 Cervical Position: Middle Station: -2 Presentation: Vertex Exam by:: [employer] RN   A&P: 28 y.o. G2P1001 [redacted]w[redacted]d here for SOL/SROM. TOLAC. #Labor: Progressing well. Expectant management. Will plan for attending to be nearby in case of shoulder dystocia. Anticipate SVD. #Pain: Epidural #FWB: Cat II but most recent part of tracing now Cat I #GBS negative  Chauncey Mann, MD 7:08 AM

## 2019-12-15 NOTE — Progress Notes (Signed)
Julia Rollins is a 28 y.o. G2P1001 at [redacted]w[redacted]d by LMP admitted for SOL/SROM.  Subjective: Reports feeling comfortable at this time with epidural in place. FOB present and supportive at bedside.  Objective: Vitals:   12/15/19 0831 12/15/19 0901 12/15/19 0908 12/15/19 0931  BP: 130/74 129/71 129/67 115/65  Pulse: 94 93 97 85  Resp: 18 18 18 18   Temp:      TempSrc:      SpO2:      Weight:      Height:        No intake/output data recorded. No intake/output data recorded.   FHT:  FHR: 130 bpm, variability: moderate,  accelerations:  Present,  decelerations:  Absent UC:   regular, every 5-7 minutes SVE:   Dilation: 6.5 Effacement (%): 80 Station: -2 Exam by:: Julia Carls RN  Labs:   Recent Labs    12/15/19 0512  WBC 10.8*  HGB 11.0*  HCT 34.5*  PLT 162    Assessment / Plan: 28 y.o. G2P1001 [redacted]w[redacted]d here for SOL/SROM. TOLAC.  Labor: Progressing normally - Expectant management for now. Preeclampsia:  no signs or symptoms of toxicity and labs stable Fetal Wellbeing:  Category I Pain Control:  Epidural I/D:  GBS negative Anticipated MOD:  SVD  Julia Rollins, SNM, BSN 12/15/2019, 9:50 AM

## 2019-12-15 NOTE — MAU Note (Signed)
Vomited since receiving pain shot had here earlier.  Water broke at 3 am.  Clear fluid.  No bleeding. Baby moving today.

## 2019-12-15 NOTE — Anesthesia Preprocedure Evaluation (Signed)
Anesthesia Evaluation    Reviewed: Allergy & Precautions, Patient's Chart, lab work & pertinent test results  Airway Mallampati: II  TM Distance: >3 FB Neck ROM: Full    Dental no notable dental hx.    Pulmonary neg pulmonary ROS,    Pulmonary exam normal breath sounds clear to auscultation       Cardiovascular negative cardio ROS Normal cardiovascular exam Rhythm:Regular Rate:Normal     Neuro/Psych negative neurological ROS  negative psych ROS   GI/Hepatic negative GI ROS, Neg liver ROS,   Endo/Other  Morbid obesity  Renal/GU negative Renal ROS  negative genitourinary   Musculoskeletal negative musculoskeletal ROS (+)   Abdominal   Peds  Hematology negative hematology ROS (+)   Anesthesia Other Findings   Reproductive/Obstetrics (+) Pregnancy                             Anesthesia Physical Anesthesia Plan  ASA: III  Anesthesia Plan: Epidural   Post-op Pain Management:    Induction:   PONV Risk Score and Plan: Treatment may vary due to age or medical condition  Airway Management Planned: Natural Airway  Additional Equipment:   Intra-op Plan:   Post-operative Plan:   Informed Consent: I have reviewed the patients History and Physical, chart, labs and discussed the procedure including the risks, benefits and alternatives for the proposed anesthesia with the patient or authorized representative who has indicated his/her understanding and acceptance.       Plan Discussed with: Anesthesiologist  Anesthesia Plan Comments: (Patient identified. Risks, benefits, options discussed with patient including but not limited to bleeding, infection, nerve damage, paralysis, failed block, incomplete pain control, headache, blood pressure changes, nausea, vomiting, reactions to medication, itching, and post partum back pain. Confirmed with bedside nurse the patient's most recent platelet  count. Confirmed with the patient that they are not taking any anticoagulation, have any bleeding history or any family history of bleeding disorders. Patient expressed understanding and wishes to proceed. All questions were answered. )        Anesthesia Quick Evaluation

## 2019-12-15 NOTE — MAU Note (Signed)
Covid swab obtained without difficulty and pt tol well. No symptoms 

## 2019-12-15 NOTE — H&P (Signed)
OBSTETRIC ADMISSION HISTORY AND PHYSICAL  Julia Rollins is a 28 y.o. female G2P1001 with IUP at 48w4dby LMP presenting for SOL/SROM. She reports +FMs, no VB, no blurry vision, headaches or peripheral edema, and RUQ pain.  She plans on breast feeding. She requests Paragard for birth control. She received her prenatal care at [facility]   Dating: By LMP --->  Estimated Date of Delivery: 12/11/19  Sono: 12/13/2019  '@[redacted]w[redacted]d'$ , CWD, normal anatomy, cephalic presentation, anterior placenatal lie, 4524g, 97% EFW  Prenatal History/Complications: LGA; EFW 936%on 12/29 Hx of C-section for fetal bradycardia  Past Medical History: Past Medical History:  Diagnosis Date  . Medical history non-contributory     Past Surgical History: Past Surgical History:  Procedure Laterality Date  . CESAREAN SECTION N/A 10/09/2017   Procedure: CESAREAN SECTION;  Surgeon: EHarlin Heys MD;  Location: [facility] ORS;  Service: Obstetrics;  Laterality: N/A;  . HERNIA REPAIR     89or 28years old    Obstetrical History: OB History    Gravida  2   Para  1   Term  1   Preterm      AB      Living  1     SAB      TAB      Ectopic      Multiple      Live Births  1           Social History: Social History   Socioeconomic History  . Marital status: Married    Spouse name: Not on file  . Number of children: 1  . Years of education: Not on file  . Highest education level: Associate degree: occupational, tHotel manager or vocational program  Occupational History  . Occupation: Hair Stylist    Comment: [employer]  Tobacco Use  . Smoking status: Never Smoker  . Smokeless tobacco: Never Used  Substance and Sexual Activity  . Alcohol use: No  . Drug use: Not Currently    Types: Marijuana    Comment: Marijuana  . Sexual activity: Yes    Birth control/protection: None  Other Topics Concern  . Not on file  Social History Narrative  . Not on file   Social Determinants of Health   Financial  Resource Strain: Low Risk   . Difficulty of Paying Living Expenses: Not very hard  Food Insecurity: No Food Insecurity  . Worried About RCharity fundraiserin the Last Year: Never true  . Ran Out of Food in the Last Year: Never true  Transportation Needs: No Transportation Needs  . Lack of Transportation (Medical): No  . Lack of Transportation (Non-Medical): No  Physical Activity: Sufficiently Active  . Days of Exercise per Week: 7 days  . Minutes of Exercise per Session: 30 min  Stress: No Stress Concern Present  . Feeling of Stress : Not at all  Social Connections: Somewhat Isolated  . Frequency of Communication with Friends and Family: More than three times a week  . Frequency of Social Gatherings with Friends and Family: More than three times a week  . Attends Religious Services: Never  . Active Member of Clubs or Organizations: No  . Attends CArchivistMeetings: Never  . Marital Status: Married    Family History: History reviewed. No pertinent family history.  Allergies: No Known Allergies  Medications Prior to Admission  Medication Sig Dispense Refill Last Dose  . Blood Pressure Monitoring (BLOOD PRESSURE MONITOR AUTOMAT) DEVI 1 kit  by Does not apply route daily. Diagnosis O09.90. Automatic blood pressure cuff with regular size cuff. Blood pressure to be monitored regularly at home. (Patient not taking: Reported on 12/13/2019) 1 kit 0   . fluconazole (DIFLUCAN) 150 MG tablet Take 1 tablet (150 mg total) by mouth daily. (Patient not taking: Reported on 12/13/2019) 1 tablet 0   . Prenatal Vit-Fe Phos-FA-Omega (VITAFOL GUMMIES) 3.33-0.333-34.8 MG CHEW Chew 3 each by mouth daily. (Patient not taking: Reported on 12/13/2019) 90 tablet 12      Review of Systems   All systems reviewed and negative except as stated in HPI  Blood pressure 129/76, pulse (!) 105, temperature 98.3 F (36.8 C), temperature source Oral, resp. rate 20, height '5\' 6"'$  (1.676 m), weight 112 kg,  last menstrual period 03/06/2019. General appearance: alert, cooperative and appears stated age Lungs: normal effort Heart: regular rate  Abdomen: soft, non-tender; bowel sounds normal Pelvic: gravid uterus Extremities: Homans sign is negative, no sign of DVT Presentation: cephalic by RN exam  Fetal monitoringBaseline: 130 bpm, Variability: Good {> 6 bpm), Accelerations: Reactive and Decelerations: Absent Uterine activityFrequency: Every 3-5 minutes Dilation: 6 Effacement (%): 90 Station: -2 Exam by:: Alycia Rossetti RN   Prenatal labs: ABO, Rh: --/--/PENDING (12/31 0505) Antibody: PENDING (12/31 0505) Rubella: 5.44 (07/10 0921) RPR: Non Reactive (10/06 0855)  HBsAg: Negative (07/10 0921)  HIV: Non Reactive (10/06 0903)  GBS: Negative/-- (12/02 1429)  2 hr Glucola WNL Genetic screening  WNL Anatomy US WNL  Prenatal Transfer Tool  Maternal Diabetes: No Genetic Screening: Normal Maternal Ultrasounds/Referrals: Normal Fetal Ultrasounds or other Referrals:  None Maternal Substance Abuse:  Yes:  Type: Marijuana Significant Maternal Medications:  None Significant Maternal Lab Results: Group B Strep negative  Results for orders placed or performed during the hospital encounter of 12/15/19 (from the past 24 hour(s))  POCT fern test   Collection Time: 12/15/19  4:51 AM  Result Value Ref Range   POCT Fern Test Positive = ruptured amniotic membanes   Type and screen [place]   Collection Time: 12/15/19  5:05 AM  Result Value Ref Range   ABO/RH(D) PENDING    Antibody Screen PENDING    Sample Expiration      12/18/2019,2359 Performed at [facility], [address], [place], [place] [address]   CBC   Collection Time: 12/15/19  5:12 AM  Result Value Ref Range   WBC 10.8 (H) 4.0 - 10.5 K/uL   RBC 4.21 3.87 - 5.11 MIL/uL   Hemoglobin 11.0 (L) 12.0 - 15.0 g/dL   HCT 34.5 (L) 36.0 - 46.0 %   MCV 81.9 80.0 - 100.0 fL   MCH 26.1 26.0 - 34.0 pg   MCHC  31.9 30.0 - 36.0 g/dL   RDW 15.7 (H) 11.5 - 15.5 %   Platelets 162 150 - 400 K/uL   nRBC 0.0 0.0 - 0.2 %  Results for orders placed or performed during the hospital encounter of 12/14/19 (from the past 24 hour(s))  [facility] Test   Collection Time: 12/14/19 11:25 PM  Result Value Ref Range   POCT Fern Test Negative = intact amniotic membranes     Patient Active Problem List   Diagnosis Date Noted  . Labor and delivery, indication for care 12/15/2019  . LGA (large for gestational age) fetus affecting management of mother 12/13/2019  . Obesity in pregnancy, antepartum, third trimester 12/07/2019  . Previous cesarean delivery affecting pregnancy 06/24/2019  . Supervision of other  normal pregnancy, antepartum 06/14/2019  . History of cesarean section 10/17/2017  . History of marijuana use 04/08/2017    Assessment/Plan:  Brentlee Sciara is a 28 y.o. G2P1001 at 20w4dhere for SOL/SROM.  #Labor: Vertex by RN exam. TOLAC due to hx of bradycardia; patient was at 7 cm. Signed TOLAC consent 10/28. Expectant management. Anticipate SVD. #Pain: Per patient request #FWB: Cat I; EFW: 4500g by LCurlene Labrumand recent [place](discussed risks of LGA delivery with patient) #ID:  GBS neg #MOF: Breast #MOC: Paragard (ordered and consented) #Circ:  declines  CBarrington Ellison MD [facility] Hato ReyFamily Medicine Fellow, FSouthwest Endoscopy Centerfor WDean [employer] Company CLemoore12/31/2020, 5:46 AM

## 2019-12-16 ENCOUNTER — Encounter (HOSPITAL_COMMUNITY): Payer: Self-pay | Admitting: Obstetrics and Gynecology

## 2019-12-16 DIAGNOSIS — O99810 Abnormal glucose complicating pregnancy: Secondary | ICD-10-CM

## 2019-12-16 HISTORY — DX: Abnormal glucose complicating pregnancy: O99.810

## 2019-12-16 NOTE — Progress Notes (Signed)
Post Partum Day 1 Subjective: Patient reports feeling well. She is tolerating PO. Ambulating and urinating without difficulty. Lochia minimal.  Objective: Blood pressure 122/90, pulse 86, temperature 98.4 F (36.9 C), temperature source Axillary, resp. rate 18, height 5\' 6"  (1.676 m), weight 112 kg, last menstrual period 03/06/2019, SpO2 99 %, unknown if currently breastfeeding.  Physical Exam:  General: alert, cooperative and appears stated age 29: appropriate Uterine Fundus: firm DVT Evaluation: No evidence of DVT seen on physical exam.  Recent Labs    12/15/19 0512  HGB 11.0*  HCT 34.5*    Assessment/Plan: Plan for discharge tomorrow per patient request Desires IUD outpatient Vitals stable; by definition patient meets criteria for gHTN - cont to monitor and consider anti-hypertensive prior to discharge if normal to borderline BP's Breasstfeeding   LOS: 1 day   12/17/19 12/16/2019, 7:27 AM

## 2019-12-16 NOTE — Lactation Note (Signed)
This note was copied from a baby's chart. Lactation Consultation Note  Patient Name: Julia Rollins [id number] Date: 12/16/2019   Initial lactation visit at 21 hours of life. Mom is a P2 who nursed her 1st child for 1 month, but says she would like to nurse this one longer. This infant has been primarily formula-fed.   The 3rd-shift RN set Mom up with a pump, which she has not yet used (or perhaps used once). When I was assessing Mom's breasts for flange size, I noted that on hand expression Mom squirted colostrum from her L breast. When I noticed that, Mom was interested in latching infant.   Infant latched & suckled, but there weren't signs of transferring milk at that time. I let Mom know & encouraged her to use her pump so that could help infant access her milk.   Mom has size 24 flanges on her pump which seem appropriate at this time. Mom has WIC.  When infant was in Willy-cradle/semi-prone hold, he was noted to have a brief transient period (2-3 minutes) of breathing quickly. He maintained good color & there were no signs of distress.  Mom prefers to use football hold when positioning.  Lurline Hare [facility] 12/16/2019, 3:05 PM

## 2019-12-16 NOTE — Anesthesia Postprocedure Evaluation (Signed)
Anesthesia Post Note  Patient: Civil engineer, contracting  Procedure(s) Performed: AN AD HOC LABOR EPIDURAL     Patient location during evaluation: Mother Baby Anesthesia Type: Epidural Level of consciousness: awake and alert Pain management: pain level controlled Vital Signs Assessment: post-procedure vital signs reviewed and stable Respiratory status: spontaneous breathing, nonlabored ventilation and respiratory function stable Cardiovascular status: stable Postop Assessment: no headache, no backache and epidural receding Anesthetic complications: no    Last Vitals:  Vitals:   12/16/19 0332 12/16/19 0740  BP: 122/90 106/71  Pulse: 86 97  Resp: 18 18  Temp: 36.9 C 37 C  SpO2: 99%     Last Pain:  Vitals:   12/16/19 0740  TempSrc: Oral  PainSc: 9    Pain Goal: Patients Stated Pain Goal: 0 (12/15/19 0443)                 Fanny Dance

## 2019-12-17 MED ORDER — IBUPROFEN 800 MG PO TABS: 800.0000 mg | ORAL_TABLET | Freq: Three times a day (TID) | ORAL | 0 refills | Status: DC | PRN

## 2019-12-17 NOTE — Lactation Note (Signed)
This note was copied from a baby's chart. Lactation Consultation Note  Patient Name: Julia Rollins [id number] Date: 12/17/2019 Reason for consult: Follow-up assessment;Term  49 hours old FT female who is being partially BF but mostly formula fed by his mother, she's a P2. Mom and baby are going home today, no weight loss for baby.   Baby just had a formula feeding of 44 ml within an hour and wasn't ready to feed at the breast, he was asleep in his bassinet. Reminded mom to discard old formula bottles after an hour of being opened. She hasn't been pumping today, but she told LC she has a [employer]inoh pump at home and that she will be using, she also got a hand pump from the hospital in addition to the DEBP that was set up in her room. Encouraged her to pump every time baby is given a bottle of formula to protect her supply.  Reviewed discharge instructions, engorgement prevention and treatment, treatment/prevention for sore nipples and red flags on when to call baby's pediatrician. Mom reported all questions and concerns were answered, she's aware of LC OP services and will contact PRN.    Maternal Data    Feeding    LATCH Score                   Interventions Interventions: Breast feeding basics reviewed  Lactation Tools Discussed/Used     Consult Status Consult Status: Complete Date: 12/17/19 Follow-up type: In-patient    Jamira Barfuss Venetia Constable 12/17/2019, 11:25 AM

## 2019-12-17 NOTE — Discharge Instructions (Signed)
Tubal Ligation Reversal, Care After This sheet gives you information about how to care for yourself after your procedure. Your health care provider may also give you more specific instructions. If you have problems or questions, contact your health care provider. What can I expect after the procedure? After the procedure, it is common to have:  Mild abdominal discomfort. This can include: ? Mild cramping. ? Gas pains or feeling bloated. ? Pain or soreness at the incision areas.  Discomfort in the shoulder area. This is caused by air that is trapped between your liver and your diaphragm. The discomfort will slowly go away on its own.  Tiredness.  A sore throat if you had a breathing tube.  Nausea or vomiting. Follow these instructions at home: Medicines  Take over-the-counter and prescription medicines only as told by your health care provider.  Do not take aspirin because it can cause bleeding.  Do not drive or use heavy machinery while taking prescription pain medicine. Activity  Rest as told by your health care provider. Gradually return to your normal activities as told by your health care provider.  Avoid sitting for a long time without moving. Get up and move around one or more times every few hours.  Do not douche, use tampons, or have sexual intercourse for an entire menstrual cycle, or as told by your health care provider.  Do not lift anything that is heavier than 10 lb (4.5 kg), or as told by your health care provider.  Have someone help you with your daily household tasks for the first 7-10 days, or as recommended by your health care provider. Incision care   Follow instructions from your health care provider about how to take care of your incision. Make sure you: ? Wash your hands with soap and water before you change your bandage (dressing). If soap and water are not available, use hand sanitizer. ? Change your dressing as told by your health care  provider. ? Leave stitches (sutures), skin glue, or adhesive strips in place. These skin closures may need to stay in place for 2 weeks or longer. If adhesive strip edges start to loosen and curl up, you may trim the loose edges.  Do not take baths, swim, or use a hot tub until your health care provider approves. You may take showers.  Check your incision area every day for signs of infection. Check for: ? Redness, swelling, or pain. ? Fluid or blood. ? Warmth. ? Pus or a bad smell. General instructions  To prevent or treat constipation while you are taking prescription pain medicine, your health care provider may recommend that you: ? Drink enough fluid to keep your urine pale yellow. ? Take over-the-counter or prescription medicines. ? Eat foods that are high in fiber, such as fresh fruits and vegetables, whole grains, and beans. ? Limit foods that are high in fat and processed sugars, such as fried and sweet foods.  Do not use any products that contain nicotine or tobacco, such as cigarettes and e-cigarettes. These can delay healing. If you need help quitting, ask your health care provider.  Keep all follow-up visits as told by your health care provider. This is important. You will need to have a follow-up X-ray dye test about 3-4 months after the surgery to make sure that your tubes are open. Contact a health care provider if:  You have signs of infection, such as: ? Redness, swelling, or pain around your incision sites. ? Fluid or blood coming  from an incision. ? An incision that feels warm to the touch. ? Pus or a bad smell coming from an incision.  Your incision breaks open.  You have a fever.  You have a rash.  You feel dizzy or lightheaded.  You cannot eat or drink without vomiting.  You are constipated. Get help right away if:  You have shortness of breath or difficulty breathing.  You have chest or leg pain.  You repeatedly feel lightheaded.  You  faint.  You have increasing pain in your abdomen.  You feel a burning sensation or pain when you urinate.  You have heavy vaginal bleeding when it is not time for your menstrual period. Summary  It is common to have a sore throat, mild abdominal pain, and fatigue after the procedure.  Take over-the-counter and prescription medicines only as told by your health care provider.  Keep all follow-up visits as told by your health care provider. This is important. You will need to have a follow-up X-ray dye test about 3-4 months after the surgery to make sure that your tubes are open. This information is not intended to replace advice given to you by your health care provider. Make sure you discuss any questions you have with your health care provider. Document Revised: 11/13/2017 Document Reviewed: 03/12/2017 Elsevier Patient Education  2020 [employer].

## 2019-12-19 ENCOUNTER — Other Ambulatory Visit: Payer: Medicaid Other

## 2019-12-21 ENCOUNTER — Other Ambulatory Visit: Payer: Medicaid Other

## 2020-01-13 ENCOUNTER — Other Ambulatory Visit: Payer: Self-pay

## 2020-01-13 ENCOUNTER — Ambulatory Visit (INDEPENDENT_AMBULATORY_CARE_PROVIDER_SITE_OTHER): Payer: Medicaid Other

## 2020-01-13 ENCOUNTER — Encounter: Payer: Self-pay | Admitting: General Practice

## 2020-01-13 DIAGNOSIS — Z302 Encounter for sterilization: Secondary | ICD-10-CM

## 2020-01-13 DIAGNOSIS — Z1389 Encounter for screening for other disorder: Secondary | ICD-10-CM | POA: Diagnosis not present

## 2020-01-13 NOTE — Patient Instructions (Addendum)
High-Fiber Diet Fiber, also called dietary fiber, is a type of carbohydrate that is found in fruits, vegetables, whole grains, and beans. A high-fiber diet can have many health benefits. Your health care provider may recommend a high-fiber diet to help:  Prevent constipation. Fiber can make your bowel movements more regular.  Lower your cholesterol.  Relieve the following conditions: ? Swelling of veins in the anus (hemorrhoids). ? Swelling and irritation (inflammation) of specific areas of the digestive tract (uncomplicated diverticulosis). ? A problem of the large intestine (colon) that sometimes causes pain and diarrhea (irritable bowel syndrome, IBS).  Prevent overeating as part of a weight-loss plan.  Prevent heart disease, type 2 diabetes, and certain cancers. What is my plan? The recommended daily fiber intake in grams (g) includes:  38 g for men age 50 or younger.  30 g for men over age 50.  25 g for women age 50 or younger.  21 g for women over age 50. You can get the recommended daily intake of dietary fiber by:  Eating a variety of fruits, vegetables, grains, and beans.  Taking a fiber supplement, if it is not possible to get enough fiber through your diet. What do I need to know about a high-fiber diet?  It is better to get fiber through food sources rather than from fiber supplements. There is not a lot of research about how effective supplements are.  Always check the fiber content on the nutrition facts label of any prepackaged food. Look for foods that contain 5 g of fiber or more per serving.  Talk with a diet and nutrition specialist (dietitian) if you have questions about specific foods that are recommended or not recommended for your medical condition, especially if those foods are not listed below.  Gradually increase how much fiber you consume. If you increase your intake of dietary fiber too quickly, you may have bloating, cramping, or gas.  Drink plenty  of water. Water helps you to digest fiber. What are tips for following this plan?  Eat a wide variety of high-fiber foods.  Make sure that half of the grains that you eat each day are whole grains.  Eat breads and cereals that are made with whole-grain flour instead of refined flour or white flour.  Eat brown rice, bulgur wheat, or millet instead of white rice.  Start the day with a breakfast that is high in fiber, such as a cereal that contains 5 g of fiber or more per serving.  Use beans in place of meat in soups, salads, and pasta dishes.  Eat high-fiber snacks, such as berries, raw vegetables, nuts, and popcorn.  Choose whole fruits and vegetables instead of processed forms like juice or sauce. What foods can I eat?  Fruits Berries. Pears. Apples. Oranges. Avocado. Prunes and raisins. Dried figs. Vegetables Sweet potatoes. Spinach. Kale. Artichokes. Cabbage. Broccoli. Cauliflower. Green peas. Carrots. Squash. Grains Whole-grain breads. Multigrain cereal. Oats and oatmeal. Brown rice. Barley. Bulgur wheat. Millet. Quinoa. Bran muffins. Popcorn. Rye wafer crackers. Meats and other proteins Navy, kidney, and pinto beans. Soybeans. Split peas. Lentils. Nuts and seeds. Dairy Fiber-fortified yogurt. Beverages Fiber-fortified soy milk. Fiber-fortified orange juice. Other foods Fiber bars. The items listed above may not be a complete list of recommended foods and beverages. Contact a dietitian for more options. What foods are not recommended? Fruits Fruit juice. Cooked, strained fruit. Vegetables Fried potatoes. Canned vegetables. Well-cooked vegetables. Grains White bread. Pasta made with refined flour. White rice. Meats and other   proteins Fatty cuts of meat. Fried chicken or fried fish. Dairy Milk. Yogurt. Cream cheese. Sour cream. Fats and oils Butters. Beverages Soft drinks. Other foods Cakes and pastries. The items listed above may not be a complete list of foods  and beverages to avoid. Contact a dietitian for more information. Summary  Fiber is a type of carbohydrate. It is found in fruits, vegetables, whole grains, and beans.  There are many health benefits of eating a high-fiber diet, such as preventing constipation, lowering blood cholesterol, helping with weight loss, and reducing your risk of heart disease, diabetes, and certain cancers.  Gradually increase your intake of fiber. Increasing too fast can result in cramping, bloating, and gas. Drink plenty of water while you increase your fiber.  The best sources of fiber include whole fruits and vegetables, whole grains, nuts, seeds, and beans. This information is not intended to replace advice given to you by your health care provider. Make sure you discuss any questions you have with your health care provider. Document Revised: 10/05/2017 Document Reviewed: 10/05/2017 Elsevier Patient Education  2020 Elsevier Inc.  Laparoscopic Tubal Ligation Laparoscopic tubal ligation is a procedure to close the fallopian tubes. This is done so that you cannot get pregnant. When the fallopian tubes are closed, the eggs that your ovaries release cannot enter the uterus, and sperm cannot reach the released eggs. You should not have this procedure if you want to get pregnant someday or if you are unsure about having more children. Tell a health care provider about:  Any allergies you have.  All medicines you are taking, including vitamins, herbs, eye drops, creams, and over-the-counter medicines.  Any problems you or family members have had with anesthetic medicines.  Any blood disorders you have.  Any surgeries you have had.  Any medical conditions you have.  Whether you are pregnant or may be pregnant.  Any past pregnancies. What are the risks? Generally, this is a safe procedure. However, problems may occur, including:  Infection.  Bleeding.  Injury to other organs in the abdomen.  Side  effects from anesthetic medicines.  Failure of the procedure. This procedure can increase your risk of a kind of pregnancy in which a fertilized egg attaches to the outside of the uterus (ectopic pregnancy). What happens before the procedure? Medicines  Ask your health care provider about: ? Changing or stopping your regular medicines. This is especially important if you are taking diabetes medicines or blood thinners. ? Taking medicines such as aspirin and ibuprofen. These medicines can thin your blood. Do not take these medicines unless your health care provider tells you to take them. ? Taking over-the-counter medicines, vitamins, herbs, and supplements. Staying hydrated  Follow instructions from your health care provider about hydration, which may include: ? Up to 2 hours before the procedure - you may continue to drink clear liquids, such as water, clear fruit juice, black coffee, and plain tea. Eating and drinking  Follow instructions from your health care provider about eating and drinking, which may include: ? 8 hours before the procedure - stop eating heavy meals or foods, such as meat, fried foods, or fatty foods. ? 6 hours before the procedure - stop eating light meals or foods, such as toast or cereal. ? 6 hours before the procedure - stop drinking milk or drinks that contain milk. ? 2 hours before the procedure - stop drinking clear liquids. General instructions  Do not use any products that contain nicotine or tobacco for at  least 4 weeks before the procedure. These products include cigarettes, e-cigarettes, and chewing tobacco. If you need help quitting, ask your health care provider.  Plan to have someone take you home from the hospital.  If you will be going home right after the procedure, plan to have someone with you for 24 hours.  Ask your health care provider: ? How your surgery site will be marked. ? What steps will be taken to help prevent infection. These may  include:  Removing hair at the surgery site.  Washing skin with a germ-killing soap.  Taking antibiotic medicine. What happens during the procedure?      An IV will be inserted into one of your veins.  You will be given one or more of the following: ? A medicine to help you relax (sedative). ? A medicine to numb the area (local anesthetic). ? A medicine to make you fall asleep (general anesthetic). ? A medicine that is injected into an area of your body to numb everything below the injection site (regional anesthetic).  Your bladder may be emptied with a small tube (catheter).  If you have been given a general anesthetic, a tube will be put down your throat to help you breathe.  Two small incisions will be made in your lower abdomen and near your belly button.  Your abdomen will be inflated with a gas. This will let the surgeon see better and will give the surgeon room to work.  A thin, lighted tube (laparoscope) with a camera attached will be inserted into your abdomen through one of the incisions. Small instruments will be inserted through the other incision.  The fallopian tubes will be tied off, burned (cauterized), or blocked with a clip, ring, or clamp. A small portion in the center of each fallopian tube may be removed.  The gas will be released from the abdomen.  The incisions will be closed with stitches (sutures).  A bandage (dressing) will be placed over the incisions. The procedure may vary among health care providers and hospitals. What happens after the procedure?  Your blood pressure, heart rate, breathing rate, and blood oxygen level will be monitored until you leave the hospital.  You will be given medicine to help with pain, nausea, and vomiting as needed. Summary  Laparoscopic tubal ligation is a procedure that is done so that you cannot get pregnant.  You should not have this procedure if you want to get pregnant someday or if you are unsure about  having more children.  The procedure is done using a thin, lighted tube (laparoscope) with a camera attached that will be inserted into your abdomen through an incision.  Follow instructions from your health care provider about eating and drinking before the procedure. This information is not intended to replace advice given to you by your health care provider. Make sure you discuss any questions you have with your health care provider. Document Revised: 05/10/2019 Document Reviewed: 10/26/2018 Elsevier Patient Education  2020 [employer].

## 2020-01-13 NOTE — Progress Notes (Signed)
   Post Partum Exam  Julia Rollins is a 29 y.o. G4P2002 female who presents for a postpartum visit. She is 4 weeks postpartum following a spontaneous vaginal delivery. I have fully reviewed the prenatal and intrapartum course. The delivery was at [redacted]w[redacted]d gestational weeks.  Anesthesia: epidural. Postpartum course has been uncomplicated. Baby's course has been uncomplicated. Baby is feeding by bottle - Gerber Gentle. Bleeding brown. Bowel function is abnormal: constipation; small bowel movement yesterday 01/12/2020. Bladder function is normal. Patient is not sexually active. Contraception method is none. Postpartum depression screening:neg; score 1  The following portions of the patient's history were reviewed and updated as appropriate: allergies, current medications, past family history, past medical history, past social history, past surgical history and problem list. Last pap smear done 06/24/2019 and was Normal  Patient endorses safety at home and reports she receives support from her husband. She denies feelings of depression. She states that she has been dealing with constipation and has been having small hard stools.  She reports some relief after bowel movements.  She states she has a stool softener, "but didn't want to take it so much."  She states she is eating "regular, but not enough vegetables."  24 hour recall: Water PBJ Sandwich Sushi Noodles   Patient states she would like a tubal ligation for [facility] and has too boys at home.  She states she was suppose to have a paragard placed, but didn't due to discomfort with IUD. Patient is not currently employed.   Review of Systems A comprehensive review of systems was negative.    Objective:  Last menstrual period 03/06/2019, unknown if currently breastfeeding.  General:  alert, cooperative and no distress   Breasts:  Not Examined  Lungs: clear to auscultation bilaterally  Heart:  regular rate and rhythm  Abdomen: normal findings: bowel  sounds normal   Vulva:  negative for erythema bilaterally and laceration bilaterally  Vagina: vagina positive for well approximated and healing laceration. Sutures identified and intact.  Attempted to pull with patient discomfort-left in place.   Cervix:  no cervical motion tenderness  Corpus: normal  Adnexa:  not evaluated  Rectal Exam: Not performed.        Assessment:   4 weeks Postpartum 2nd Degree Laceration Desires Permanent Sterilization  Plan:  -Instructed to abstain from sex for about 2 weeks to allow complete healing of laceration. -Encouraged to practice Kegel exercises to improve pelvic floor muscle tone. -Okay to return to work and exercise activities as desired. -Extensive discussion regarding permanent sterilization.  Patient states she has no desire to have more children. -Tubal consents to be signed and will schedule for pre-op visit with MD. -RTO for yearly exam or prn.

## 2020-01-24 ENCOUNTER — Telehealth (INDEPENDENT_AMBULATORY_CARE_PROVIDER_SITE_OTHER): Payer: Medicaid Other | Admitting: Obstetrics & Gynecology

## 2020-01-24 DIAGNOSIS — Z302 Encounter for sterilization: Secondary | ICD-10-CM

## 2020-01-24 NOTE — Progress Notes (Signed)
Geophysical data processor.

## 2020-01-24 NOTE — Progress Notes (Signed)
TELEHEALTH VIRTUAL GYNECOLOGY VISIT ENCOUNTER NOTE  I connected with Julia Rollins on 01/24/20 at 11:15 AM EST by telephone at home and verified that I am speaking with the correct person using two identifiers.   I discussed the limitations, risks, security and privacy concerns of performing an evaluation and management service by telephone and the availability of in person appointments. I also discussed with the patient that there may be a patient responsible charge related to this service. The patient expressed understanding and agreed to proceed.   History:  Julia Rollins is a 29 y.o. G75P2002 female being evaluated today for sterilization by laparoscopy. She denies any abnormal vaginal discharge, bleeding, pelvic pain or other concerns.    She is postpartum, BTL papers signed 12/19/19   Past Medical History:  Diagnosis Date  . Gestational hyperglycemia 12/16/2019  . History of cesarean section 10/17/2017  . LGA (large for gestational age) fetus affecting management of mother 12/13/2019   [place] estimates baby to be 4500 gm or 10 pounds on 12-13-19  . Medical history non-contributory   . Obesity in pregnancy, antepartum, third trimester 12/07/2019  . Previous cesarean delivery affecting pregnancy 06/24/2019   Desires TOLAC  . Supervision of other normal pregnancy, antepartum 06/14/2019    Nursing Staff Lavelle Berland Office Location [facility] Dating  LMP Language  English Anatomy US  Normal Flu Vaccine  N/A Genetic Screen  NIPS: Normal  AFP: Negative  TDaP vaccine    Hgb A1C or  GTT Early  Third trimester nmL 73-132-94 Rhogam  N/A   LAB RESULTS  Feeding Plan Breast Blood Type O/Positive/-- (07/10 7408)  Contraception Paragard Antibody Negative (07/10 1448) Circumcision If boy, No Rube   Past Surgical History:  Procedure Laterality Date  . CESAREAN SECTION N/A 10/09/2017   Procedure: CESAREAN SECTION;  Surgeon: Harlin Heys, MD;  Location: [facility] ORS;  Service: Obstetrics;  Laterality: N/A;  .  HERNIA REPAIR     38 or 29 years old   The following portions of the patient's history were reviewed and updated as appropriate: allergies, current medications, past family history, past medical history, past social history, past surgical history and problem list.   Health Maintenance:  Normal pap and negative HRHPV on 06/24/19.    Review of Systems:  Pertinent items noted in HPI and remainder of comprehensive ROS otherwise negative.  Physical Exam:   General:  Alert, oriented and cooperative.   Mental Status: Normal mood and affect perceived. Normal judgment and thought content.  Physical exam deferred due to nature of the encounter  Labs and Imaging No results found for this or any previous visit (from the past 336 hour(s)). No results found.    Assessment and Plan:     1. Request for sterilization 29 y.o. [id number] with undesired fertility desires permanent sterilization. Risks and benefits of laparoscopic tubal sterilization procedure was discussed with the patient including permanence of method, Filshie clips,  bleeding, infection, injury to surrounding organs, anesthesia and need for additional procedures and future menstrual problem. Risk failure of 0.5-1% with increased risk of ectopic gestation if pregnancy occurs was also discussed with patient. Patient verbalized understanding and all questions were answered.      I discussed the assessment and treatment plan with the patient. The patient was provided an opportunity to ask questions and all were answered. The patient agreed with the plan and demonstrated an understanding of the instructions.   The patient was advised to call back or seek an in-person evaluation/go to  the ED if the symptoms worsen or if the condition fails to improve as anticipated.  I provided 12 minutes of non-face-to-face time during this encounter.   Scheryl Darter, MD [facility], [facility]

## 2020-02-09 ENCOUNTER — Encounter (HOSPITAL_BASED_OUTPATIENT_CLINIC_OR_DEPARTMENT_OTHER): Payer: Self-pay | Admitting: Obstetrics & Gynecology

## 2020-02-09 ENCOUNTER — Other Ambulatory Visit: Payer: Self-pay

## 2020-02-10 ENCOUNTER — Other Ambulatory Visit: Payer: Self-pay | Admitting: Obstetrics & Gynecology

## 2020-02-10 NOTE — Progress Notes (Signed)
Orders for LBTL  

## 2020-02-11 ENCOUNTER — Other Ambulatory Visit (HOSPITAL_COMMUNITY)
Admission: RE | Admit: 2020-02-11 | Discharge: 2020-02-11 | Disposition: A | Discharge status: Home / Self Care | Payer: Medicaid Other | Source: Ambulatory Visit | Attending: Obstetrics & Gynecology | Admitting: Obstetrics & Gynecology

## 2020-02-11 DIAGNOSIS — Z01812 Encounter for preprocedural laboratory examination: Secondary | ICD-10-CM | POA: Insufficient documentation

## 2020-02-11 DIAGNOSIS — Z20822 Contact with and (suspected) exposure to covid-19: Secondary | ICD-10-CM | POA: Diagnosis not present

## 2020-02-11 LAB — SARS CORONAVIRUS 2 (TAT 6-24 HRS): SARS Coronavirus 2: NEGATIVE

## 2020-02-14 ENCOUNTER — Encounter (HOSPITAL_BASED_OUTPATIENT_CLINIC_OR_DEPARTMENT_OTHER)
Admission: RE | Admit: 2020-02-14 | Discharge: 2020-02-14 | Disposition: A | Discharge status: Home / Self Care | Payer: Medicaid Other | Source: Ambulatory Visit | Attending: Obstetrics & Gynecology | Admitting: Obstetrics & Gynecology

## 2020-02-14 DIAGNOSIS — Z01812 Encounter for preprocedural laboratory examination: Secondary | ICD-10-CM | POA: Insufficient documentation

## 2020-02-14 LAB — POCT PREGNANCY, URINE: Preg Test, Ur: NEGATIVE

## 2020-02-14 NOTE — Progress Notes (Signed)

## 2020-02-14 NOTE — Progress Notes (Signed)
Notified pt of POCT and to pick up drink, pt verbalized understanding.

## 2020-02-15 ENCOUNTER — Other Ambulatory Visit: Payer: Self-pay

## 2020-02-15 ENCOUNTER — Encounter (HOSPITAL_BASED_OUTPATIENT_CLINIC_OR_DEPARTMENT_OTHER)
Admission: RE | Disposition: A | Discharge status: Home / Self Care | Payer: Self-pay | Source: Home / Self Care | Attending: Obstetrics & Gynecology

## 2020-02-15 ENCOUNTER — Encounter (HOSPITAL_BASED_OUTPATIENT_CLINIC_OR_DEPARTMENT_OTHER): Payer: Self-pay | Admitting: Obstetrics & Gynecology

## 2020-02-15 ENCOUNTER — Ambulatory Visit (HOSPITAL_BASED_OUTPATIENT_CLINIC_OR_DEPARTMENT_OTHER): Payer: Medicaid Other | Admitting: Anesthesiology

## 2020-02-15 ENCOUNTER — Ambulatory Visit (HOSPITAL_BASED_OUTPATIENT_CLINIC_OR_DEPARTMENT_OTHER)
Admission: RE | Admit: 2020-02-15 | Discharge: 2020-02-15 | Disposition: A | Discharge status: Home / Self Care | Payer: Medicaid Other | Attending: Obstetrics & Gynecology | Admitting: Obstetrics & Gynecology

## 2020-02-15 DIAGNOSIS — Z302 Encounter for sterilization: Secondary | ICD-10-CM | POA: Diagnosis present

## 2020-02-15 DIAGNOSIS — E669 Obesity, unspecified: Secondary | ICD-10-CM | POA: Diagnosis not present

## 2020-02-15 DIAGNOSIS — Z6836 Body mass index (BMI) 36.0-36.9, adult: Secondary | ICD-10-CM | POA: Insufficient documentation

## 2020-02-15 DIAGNOSIS — Z87898 Personal history of other specified conditions: Secondary | ICD-10-CM

## 2020-02-15 DIAGNOSIS — F1291 Cannabis use, unspecified, in remission: Secondary | ICD-10-CM

## 2020-02-15 HISTORY — PX: LAPAROSCOPIC TUBAL LIGATION: SHX1937

## 2020-02-15 SURGERY — LIGATION, FALLOPIAN TUBE, LAPAROSCOPIC
Anesthesia: General | Site: Abdomen | Laterality: Bilateral

## 2020-02-15 MED ORDER — PROPOFOL 10 MG/ML IV BOLUS
INTRAVENOUS | Status: DC | PRN
  Administered 2020-02-15: 150 mg via INTRAVENOUS

## 2020-02-15 MED ORDER — DEXAMETHASONE SODIUM PHOSPHATE 10 MG/ML IJ SOLN
INTRAMUSCULAR | Status: DC | PRN
  Administered 2020-02-15: 5 mg via INTRAVENOUS

## 2020-02-15 MED ORDER — LIDOCAINE 2% (20 MG/ML) 5 ML SYRINGE
INTRAMUSCULAR | Status: AC
  Filled 2020-02-15: qty 5

## 2020-02-15 MED ORDER — LACTATED RINGERS IV SOLN: INTRAVENOUS | Status: DC

## 2020-02-15 MED ORDER — FENTANYL CITRATE (PF) 100 MCG/2ML IJ SOLN
INTRAMUSCULAR | Status: AC
  Filled 2020-02-15: qty 2

## 2020-02-15 MED ORDER — MIDAZOLAM HCL 2 MG/2ML IJ SOLN: 1.0000 mg | INTRAMUSCULAR | Status: DC | PRN

## 2020-02-15 MED ORDER — ROCURONIUM BROMIDE 10 MG/ML (PF) SYRINGE
PREFILLED_SYRINGE | INTRAVENOUS | Status: DC | PRN
  Administered 2020-02-15: 50 mg via INTRAVENOUS

## 2020-02-15 MED ORDER — KETOROLAC TROMETHAMINE 30 MG/ML IJ SOLN
INTRAMUSCULAR | Status: AC
  Filled 2020-02-15: qty 1

## 2020-02-15 MED ORDER — ONDANSETRON HCL 4 MG/2ML IJ SOLN
INTRAMUSCULAR | Status: DC | PRN
  Administered 2020-02-15: 4 mg via INTRAVENOUS

## 2020-02-15 MED ORDER — BUPIVACAINE HCL (PF) 0.25 % IJ SOLN
INTRAMUSCULAR | Status: DC | PRN
  Administered 2020-02-15: 6 mL

## 2020-02-15 MED ORDER — SUGAMMADEX SODIUM 200 MG/2ML IV SOLN
INTRAVENOUS | Status: DC | PRN
  Administered 2020-02-15: 200 mg via INTRAVENOUS

## 2020-02-15 MED ORDER — SUGAMMADEX SODIUM 500 MG/5ML IV SOLN
INTRAVENOUS | Status: AC
  Filled 2020-02-15: qty 5

## 2020-02-15 MED ORDER — PROMETHAZINE HCL 25 MG/ML IJ SOLN: 6.2500 mg | INTRAMUSCULAR | Status: DC | PRN

## 2020-02-15 MED ORDER — KETOROLAC TROMETHAMINE 30 MG/ML IJ SOLN: 30.0000 mg | Freq: Once | INTRAMUSCULAR | Status: DC | PRN

## 2020-02-15 MED ORDER — FENTANYL CITRATE (PF) 100 MCG/2ML IJ SOLN
25.0000 ug | INTRAMUSCULAR | Status: DC | PRN
  Administered 2020-02-15 (×3): 50 ug via INTRAVENOUS

## 2020-02-15 MED ORDER — OXYCODONE-ACETAMINOPHEN 5-325 MG PO TABS: 1.0000 | ORAL_TABLET | Freq: Four times a day (QID) | ORAL | 0 refills | Status: DC | PRN

## 2020-02-15 MED ORDER — MIDAZOLAM HCL 2 MG/2ML IJ SOLN
INTRAMUSCULAR | Status: AC
  Filled 2020-02-15: qty 2

## 2020-02-15 MED ORDER — FENTANYL CITRATE (PF) 100 MCG/2ML IJ SOLN
INTRAMUSCULAR | Status: DC | PRN
  Administered 2020-02-15: 50 ug via INTRAVENOUS
  Administered 2020-02-15: 100 ug via INTRAVENOUS
  Administered 2020-02-15: 50 ug via INTRAVENOUS

## 2020-02-15 MED ORDER — BUPIVACAINE HCL (PF) 0.25 % IJ SOLN
INTRAMUSCULAR | Status: AC
  Filled 2020-02-15: qty 30

## 2020-02-15 MED ORDER — MIDAZOLAM HCL 2 MG/2ML IJ SOLN
INTRAMUSCULAR | Status: DC | PRN
  Administered 2020-02-15: 2 mg via INTRAVENOUS

## 2020-02-15 MED ORDER — ONDANSETRON HCL 4 MG/2ML IJ SOLN
INTRAMUSCULAR | Status: AC
  Filled 2020-02-15: qty 2

## 2020-02-15 MED ORDER — LIDOCAINE HCL (CARDIAC) PF 100 MG/5ML IV SOSY
PREFILLED_SYRINGE | INTRAVENOUS | Status: DC | PRN
  Administered 2020-02-15: 100 mg via INTRAVENOUS

## 2020-02-15 MED ORDER — PROPOFOL 10 MG/ML IV BOLUS
INTRAVENOUS | Status: AC
  Filled 2020-02-15: qty 20

## 2020-02-15 MED ORDER — FENTANYL CITRATE (PF) 100 MCG/2ML IJ SOLN: 50.0000 ug | INTRAMUSCULAR | Status: DC | PRN

## 2020-02-15 MED ORDER — KETOROLAC TROMETHAMINE 30 MG/ML IJ SOLN
INTRAMUSCULAR | Status: DC | PRN
  Administered 2020-02-15: 30 mg via INTRAVENOUS

## 2020-02-15 SURGICAL SUPPLY — 29 items
CATH ROBINSON RED A/P 16FR (CATHETERS) ×3 IMPLANT
CLIP FILSHIE TUBAL LIGA STRL (Clip) ×3 IMPLANT
COVER MAYO STAND STRL (DRAPES) IMPLANT
DERMABOND ADVANCED (GAUZE/BANDAGES/DRESSINGS) ×2
DERMABOND ADVANCED .7 DNX12 (GAUZE/BANDAGES/DRESSINGS) ×1 IMPLANT
DRSG OPSITE POSTOP 3X4 (GAUZE/BANDAGES/DRESSINGS) ×3 IMPLANT
DURAPREP 26ML APPLICATOR (WOUND CARE) ×3 IMPLANT
GAUZE 4X4 16PLY RFD (DISPOSABLE) ×3 IMPLANT
GLOVE BIO SURGEON STRL SZ 6.5 (GLOVE) ×2 IMPLANT
GLOVE BIO SURGEONS STRL SZ 6.5 (GLOVE) ×1
GLOVE BIOGEL PI IND STRL 7.0 (GLOVE) ×4 IMPLANT
GLOVE BIOGEL PI INDICATOR 7.0 (GLOVE) ×8
GOWN STRL REUS W/TWL LRG LVL3 (GOWN DISPOSABLE) ×6 IMPLANT
NEEDLE INSUFFLATION 120MM (ENDOMECHANICALS) ×3 IMPLANT
PACK LAPAROSCOPY BASIN (CUSTOM PROCEDURE TRAY) ×3 IMPLANT
PACK TRENDGUARD 450 HYBRID PRO (MISCELLANEOUS) ×1 IMPLANT
PACK TRENDGUARD 600 HYBRD PROC (MISCELLANEOUS) IMPLANT
PAD ARMBOARD 7.5X6 YLW CONV (MISCELLANEOUS) ×6 IMPLANT
PAD OB MATERNITY 4.3X12.25 (PERSONAL CARE ITEMS) ×3 IMPLANT
PAD PREP 24X48 CUFFED NSTRL (MISCELLANEOUS) ×3 IMPLANT
SET TUBE SMOKE EVAC HIGH FLOW (TUBING) ×3 IMPLANT
SLEEVE SCD COMPRESS KNEE MED (MISCELLANEOUS) ×6 IMPLANT
SUT VICRYL 0 UR6 27IN ABS (SUTURE) ×3 IMPLANT
SUT VICRYL 4-0 PS2 18IN ABS (SUTURE) ×3 IMPLANT
TOWEL GREEN STERILE FF (TOWEL DISPOSABLE) ×6 IMPLANT
TRENDGUARD 450 HYBRID PRO PACK (MISCELLANEOUS) ×3
TRENDGUARD 600 HYBRID PROC PK (MISCELLANEOUS)
TROCAR XCEL DIL TIP R 11M (ENDOMECHANICALS) ×3 IMPLANT
WARMER LAPAROSCOPE (MISCELLANEOUS) ×3 IMPLANT

## 2020-02-15 NOTE — H&P (Signed)
Julia Rollins is an 29 y.o. female. [id number] Patient's last menstrual period was 02/09/2020. Patient is scheduled for laparoscopic bilateral tubal ligation today. She is postpartum from a VBAC 12/14/20. She has abstained from intercourse. Pertinent Gynecological History:  Contraception: abstinence DES exposure: denies Blood transfusions: none Sexually transmitted diseases: no past history Last pap: normal Date: 06/2019   Menstrual History: Patient's last menstrual period was 02/09/2020.    Past Medical History:  Diagnosis Date  . Gestational hyperglycemia 12/16/2019  . History of cesarean section 10/17/2017  . LGA (large for gestational age) fetus affecting management of mother 12/13/2019   [place] estimates baby to be 4500 gm or 10 pounds on 12-13-19  . Medical history non-contributory   . Obesity in pregnancy, antepartum, third trimester 12/07/2019  . Previous cesarean delivery affecting pregnancy 06/24/2019   Desires TOLAC  . Supervision of other normal pregnancy, antepartum 06/14/2019    Nursing Staff Provider Office Location [facility] Dating  LMP Language  English Anatomy US  Normal Flu Vaccine  N/A Genetic Screen  NIPS: Normal  AFP: Negative  TDaP vaccine    Hgb A1C or  GTT Early  Third trimester nmL 73-132-94 Rhogam  N/A   LAB RESULTS  Feeding Plan Breast Blood Type O/Positive/-- (07/10 1517)  Contraception Paragard Antibody Negative (07/10 6160) Circumcision If boy, No Rube    Past Surgical History:  Procedure Laterality Date  . CESAREAN SECTION N/A 10/09/2017   Procedure: CESAREAN SECTION;  Surgeon: Julia Heys, MD;  Location: [facility] ORS;  Service: Obstetrics;  Laterality: N/A;  . HERNIA REPAIR     15 or 29 years old    History reviewed. No pertinent family history.  Social History:  reports that she has never smoked. She has never used smokeless tobacco. She reports current alcohol use. She reports current drug use. Drug: Marijuana.  Allergies: No Known  Allergies  Medications Prior to Admission  Medication Sig Dispense Refill Last Dose  . Blood Pressure Monitoring (BLOOD PRESSURE MONITOR AUTOMAT) DEVI 1 kit by Does not apply route daily. Diagnosis O09.90. Automatic blood pressure cuff with regular size cuff. Blood pressure to be monitored regularly at home. (Patient not taking: Reported on 12/13/2019) 1 kit 0   . ibuprofen (IBU) 800 MG tablet Take 1 tablet (800 mg total) by mouth every 8 (eight) hours as needed. 30 tablet 0   . Prenatal Vit-Fe Phos-FA-Omega (VITAFOL GUMMIES) 3.33-0.333-34.8 MG CHEW Chew 3 each by mouth daily. (Patient not taking: Reported on 12/13/2019) 90 tablet 12     Review of Systems  Constitutional: Negative.   Respiratory: Negative.   Cardiovascular: Negative.   Gastrointestinal: Negative.   Genitourinary: Negative.     Blood pressure 115/66, pulse 71, temperature 97.8 F (36.6 C), temperature source Oral, resp. rate 20, height '5\' 6"'$  (1.676 m), weight 101.8 kg, last menstrual period 02/09/2020, SpO2 100 %, not currently breastfeeding. Physical Exam  Constitutional: She is oriented to person, place, and time. She appears well-developed and well-nourished.  Cardiovascular: Normal rate.  Respiratory: Effort normal.  GI: She exhibits no distension.  Neurological: She is alert and oriented to person, place, and time.  Psychiatric: She has a normal mood and affect. Her behavior is normal.    Results for orders placed or performed during the hospital encounter of 02/14/20 (from the past 24 hour(s))  Pregnancy, urine POC     Status: None   Collection Time: 02/14/20 11:54 AM  Result Value Ref Range   Preg Test, Ur NEGATIVE NEGATIVE  CBC    Component Value Date/Time   WBC 10.8 (H) 12/15/2019 0512   RBC 4.21 12/15/2019 0512   HGB 11.0 (L) 12/15/2019 0512   HGB 11.7 09/20/2019 0906   HCT 34.5 (L) 12/15/2019 0512   HCT 36.3 09/20/2019 0906   PLT 162 12/15/2019 0512   PLT 190 09/20/2019 0906   MCV 81.9  12/15/2019 0512   MCV 86 09/20/2019 0906   MCH 26.1 12/15/2019 0512   MCHC 31.9 12/15/2019 0512   RDW 15.7 (H) 12/15/2019 0512   RDW 13.4 09/20/2019 0906   LYMPHSABS 1.4 06/24/2019 0921   EOSABS 0.0 06/24/2019 0921   BASOSABS 0.0 06/24/2019 7793      Assessment/Plan: Request for sterilization 29 y.o. [id number] with undesired fertility desires permanent sterilization. Risks and benefits of laparoscopic tubal sterilization procedure was discussed with the patient including permanence of method, Filshie clips,  bleeding, infection, injury to surrounding organs, anesthesia and need for additional procedures and future menstrual problem. Risk failure of 0.5-1% with increased risk of ectopic gestation if pregnancy occurs was also discussed with patient. Patient verbalized understanding and all questions were answered  Emeterio Reeve 02/15/2020, 9:06 AM

## 2020-02-15 NOTE — Anesthesia Postprocedure Evaluation (Signed)
Anesthesia Post Note  Patient: Civil engineer, contracting  Procedure(s) Performed: LAPAROSCOPIC TUBAL LIGATION (Bilateral Abdomen)     Patient location during evaluation: PACU Anesthesia Type: General Level of consciousness: awake and alert Pain management: pain level controlled Vital Signs Assessment: post-procedure vital signs reviewed and stable Respiratory status: spontaneous breathing, nonlabored ventilation, respiratory function stable and patient connected to nasal cannula oxygen Cardiovascular status: blood pressure returned to baseline and stable Postop Assessment: no apparent nausea or vomiting Anesthetic complications: no    Last Vitals:  Vitals:   02/15/20 1018 02/15/20 1030  BP: 133/82 136/88  Pulse: (!) 117 96  Resp: 13 20  Temp: 36.7 C   SpO2: 100% 100%    Last Pain:  Vitals:   02/15/20 1030  TempSrc:   PainSc: 6                  Sameen Leas S

## 2020-02-15 NOTE — Anesthesia Preprocedure Evaluation (Signed)
Anesthesia Evaluation  Patient identified by MRN, date of birth, ID band Patient awake    Reviewed: Allergy & Precautions, NPO status , Patient's Chart, lab work & pertinent test results  Airway Mallampati: II  TM Distance: >3 FB Neck ROM: Full    Dental no notable dental hx.    Pulmonary neg pulmonary ROS,    Pulmonary exam normal breath sounds clear to auscultation       Cardiovascular negative cardio ROS Normal cardiovascular exam Rhythm:Regular Rate:Normal     Neuro/Psych negative neurological ROS  negative psych ROS   GI/Hepatic negative GI ROS, Neg liver ROS,   Endo/Other  negative endocrine ROS  Renal/GU negative Renal ROS  negative genitourinary   Musculoskeletal negative musculoskeletal ROS (+)   Abdominal   Peds negative pediatric ROS (+)  Hematology negative hematology ROS (+)   Anesthesia Other Findings   Reproductive/Obstetrics negative OB ROS                             Anesthesia Physical Anesthesia Plan  ASA: I  Anesthesia Plan: General   Post-op Pain Management:    Induction: Intravenous  PONV Risk Score and Plan: 3 and Ondansetron, Dexamethasone and Treatment may vary due to age or medical condition  Airway Management Planned: Oral ETT  Additional Equipment:   Intra-op Plan:   Post-operative Plan: Extubation in OR  Informed Consent: I have reviewed the patients History and Physical, chart, labs and discussed the procedure including the risks, benefits and alternatives for the proposed anesthesia with the patient or authorized representative who has indicated his/her understanding and acceptance.     Dental advisory given  Plan Discussed with: CRNA and Surgeon  Anesthesia Plan Comments:         Anesthesia Quick Evaluation  

## 2020-02-15 NOTE — Discharge Instructions (Signed)
Laparoscopic Tubal Ligation, Care After This sheet gives you information about how to care for yourself after your procedure. Your health care provider may also give you more specific instructions. If you have problems or questions, contact your health care provider. What can I expect after the procedure? After the procedure, it is common to have:  A sore throat.  Discomfort in your shoulder.  Mild discomfort or cramping in your abdomen.  Gas pains.  Pain or soreness in the area where the surgical incision was made.  A bloated feeling.  Tiredness.  Nausea.  Vomiting. Follow these instructions at home: Medicines  Take over-the-counter and prescription medicines only as told by your health care provider.  Do not take aspirin because it can cause bleeding.  Ask your health care provider if the medicine prescribed to you: ? Requires you to avoid driving or using heavy machinery. ? Can cause constipation. You may need to take actions to prevent or treat constipation, such as:  Drink enough fluid to keep your urine pale yellow.  Take over-the-counter or prescription medicines.  Eat foods that are high in fiber, such as beans, whole grains, and fresh fruits and vegetables.  Limit foods that are high in fat and processed sugars, such as fried or sweet foods. Incision care      Follow instructions from your health care provider about how to take care of your incision. Make sure you: ? Wash your hands with soap and water before and after you change your bandage (dressing). If soap and water are not available, use hand sanitizer. ? Change your dressing as told by your health care provider. ? Leave stitches (sutures), skin glue, or adhesive strips in place. These skin closures may need to stay in place for 2 weeks or longer. If adhesive strip edges start to loosen and curl up, you may trim the loose edges. Do not remove adhesive strips completely unless your health care provider  tells you to do that.  Check your incision area every day for signs of infection. Check for: ? Redness, swelling, or pain. ? Fluid or blood. ? Warmth. ? Pus or a bad smell. Activity  Rest as told by your health care provider.  Avoid sitting for a long time without moving. Get up to take short walks every 1-2 hours. This is important to improve blood flow and breathing. Ask for help if you feel weak or unsteady.  Return to your normal activities as told by your health care provider. Ask your health care provider what activities are safe for you. General instructions  Do not take baths, swim, or use a hot tub until your health care provider approves. Ask your health care provider if you may take showers. You may only be allowed to take sponge baths.  Have someone help you with your daily household tasks for the first few days.  Keep all follow-up visits as told by your health care provider. This is important. Contact a health care provider if:  You have redness, swelling, or pain around your incision.  Your incision feels warm to the touch.  You have pus or a bad smell coming from your incision.  The edges of your incision break open after the sutures have been removed.  Your pain does not improve after 2-3 days.  You have a rash.  You repeatedly become dizzy or light-headed.  Your pain medicine is not helping. Get help right away if you:  Have a fever.  Faint.  Have increasing   pain in your abdomen.  Have severe pain in one or both of your shoulders.  Have fluid or blood coming from your sutures or from your vagina.  Have shortness of breath or difficulty breathing.  Have chest pain or leg pain.  Have ongoing nausea, vomiting, or diarrhea. Summary  After the procedure, it is common to have mild discomfort or cramping in your abdomen.  Take over-the-counter and prescription medicines only as told by your health care provider.  Watch for symptoms that should  prompt you to call your health care provider.  Keep all follow-up visits as told by your health care provider. This is important. This information is not intended to replace advice given to you by your health care provider. Make sure you discuss any questions you have with your health care provider. Document Revised: 05/10/2019 Document Reviewed: 10/26/2018 Elsevier Patient Education  2020 [employer].   Post Anesthesia Home Care Instructions  Activity: Get plenty of rest for the remainder of the day. A responsible individual must stay with you for 24 hours following the procedure.  For the next 24 hours, DO NOT: -Drive a car -Advertising copywriter -Drink alcoholic beverages -Take any medication unless instructed by your physician -Make any legal decisions or sign important papers.  Meals: Start with liquid foods such as gelatin or soup. Progress to regular foods as tolerated. Avoid greasy, spicy, heavy foods. If nausea and/or vomiting occur, drink only clear liquids until the nausea and/or vomiting subsides. Call your physician if vomiting continues.  Special Instructions/Symptoms: Your throat may feel dry or sore from the anesthesia or the breathing tube placed in your throat during surgery. If this causes discomfort, gargle with warm salt water. The discomfort should disappear within 24 hours.  If you had a scopolamine patch placed behind your ear for the management of post- operative nausea and/or vomiting:  1. The medication in the patch is effective for 72 hours, after which it should be removed.  Wrap patch in a tissue and discard in the trash. Wash hands thoroughly with soap and water. 2. You may remove the patch earlier than 72 hours if you experience unpleasant side effects which may include dry mouth, dizziness or visual disturbances. 3. Avoid touching the patch. Wash your hands with soap and water after contact with the patch.    No Ibuprofen until 4 pm

## 2020-02-15 NOTE — Anesthesia Procedure Notes (Signed)
Procedure Name: Intubation Date/Time: 02/15/2020 9:39 AM Performed by: Raenette Rover, CRNA Pre-anesthesia Checklist: Patient identified, Emergency Drugs available, Suction available and Patient being monitored Patient Re-evaluated:Patient Re-evaluated prior to induction Oxygen Delivery Method: Circle system utilized Preoxygenation: Pre-oxygenation with 100% oxygen Induction Type: IV induction Ventilation: Mask ventilation without difficulty Laryngoscope Size: Mac and 3 Grade View: Grade I Tube type: Oral Tube size: 7.0 mm Number of attempts: 1 Airway Equipment and Method: Stylet Placement Confirmation: ETT inserted through vocal cords under direct vision,  breath sounds checked- equal and bilateral and positive ETCO2 Secured at: 21 cm Tube secured with: Tape Dental Injury: Teeth and Oropharynx as per pre-operative assessment

## 2020-02-15 NOTE — Transfer of Care (Signed)
Immediate Anesthesia Transfer of Care Note  Patient: Julia Rollins  Procedure(s) Performed: LAPAROSCOPIC TUBAL LIGATION (Bilateral Abdomen)  Patient Location: PACU  Anesthesia Type:General  Level of Consciousness: awake, alert , oriented, drowsy and patient cooperative  Airway & Oxygen Therapy: Patient Spontanous Breathing and Patient connected to face mask oxygen  Post-op Assessment: Report given to RN and Post -op Vital signs reviewed and stable  Post vital signs: Reviewed and stable  Last Vitals:  Vitals Value Taken Time  BP 133/82 02/15/20 1018  Temp    Pulse 113 02/15/20 1019  Resp 27 02/15/20 1019  SpO2 100 % 02/15/20 1019  Vitals shown include unvalidated device data.  Last Pain:  Vitals:   02/15/20 0847  TempSrc: Oral  PainSc: 0-No pain         Complications: No apparent anesthesia complications

## 2020-02-15 NOTE — Op Note (Signed)
Julia Rollins 02/15/2020  PREOPERATIVE DIAGNOSIS:  Undesired fertility  POSTOPERATIVE DIAGNOSIS:  Undesired fertility  PROCEDURE:  Laparoscopic Bilateral Tubal Sterilization using Filshie Clips   SURGEON: Adam Phenix, MD   ANESTHESIA:  General endotracheal  COMPLICATIONS:  None immediate.  ESTIMATED BLOOD LOSS:  Less than 20 ml.  FLUIDS: 1000 ml LR.  URINE OUTPUT:  75 ml of clear urine.  INDICATIONS: 29 y.o. [id number]  with undesired fertility, desires permanent sterilization. Other reversible forms of contraception were discussed with patient; she declines all other modalities.  Risks of procedure discussed with patient including permanence of method, bleeding, infection, injury to surrounding organs and need for additional procedures including laparotomy, risk of regret.  Failure risk of 0.5-1% with increased risk of ectopic gestation if pregnancy occurs was also discussed with patient.      FINDINGS:  Normal uterus, tubes, and ovaries.  TECHNIQUE:  The patient was taken to the operating room where general anesthesia was obtained without difficulty.  She was then placed in the dorsal lithotomy position and prepared and draped in sterile fashion.  After an adequate timeout was performed, a bivalved speculum was then placed in the patient's vagina, and the anterior lip of cervix grasped with the single-tooth tenaculum.  The uterine manipulator was then advanced into the uterus.  The speculum was removed from the vagina.  Attention was then turned to the patient's abdomen where a 11-mm skin incision was made in the umbilical fold. Veress needle was inserted  The abdomen was then insufflated with carbon dioxide gas and adequate pneumoperitoneum was obtained.  The 11-mm trocar and sleeve were then advanced without difficulty.  A survey of the patient's pelvis and abdomen revealed entirely normal anatomy.  The fallopian tubes were observed and found to be normal in appearance. The Filshie  clip applicator was placed through the operative port, and a Filshie clip was placed on the right fallopian tube ,about 2 cm from the cornual attachment, with care given to incorporate the underlying mesosalpinx.  A similar process was carried out on the contralateral side allowing for bilateral tubal sterilization.   Good hemostasis was noted overall..The instruments were then removed from the patient's abdomen and the fascial incision was repaired with 0 Vicryl, and the skin was closed with Dermabond.  The uterine manipulator and the tenaculum were removed from the vagina without complications. The patient tolerated the procedure well.  Sponge, lap, and needle counts were correct times two.  The patient was then taken to the recovery room awake, extubated and in stable condition.  Adam Phenix, MD 02/15/2020 10:27 AM

## 2020-02-16 ENCOUNTER — Telehealth: Payer: Self-pay

## 2020-02-16 NOTE — Telephone Encounter (Signed)
Return call to pt after consulting with provider about BTL post care instruction Pt was not ava LMOVM.

## 2020-02-16 NOTE — Progress Notes (Signed)
Patient showered after surgery, the day of surgery even though she was instructed not to. She called on 02/15/19 after discharge to say her dressing was coming up on the edges after her shower. She was instructed to reinforce and contact her physician. Today during her post op phone call she again states she showered yesterday and her dressing came up on the edges and wants to know what to do and when she can remove it. She was again instructed to call her physician as this dressing is supposed to stay in place until her follow up appointment and should not come off. She asked why she was not given back up dressing material, it was explained that she was not to change the dressing and if her dressing is coming off she needs to contact her surgeon ASAP.

## 2020-03-21 ENCOUNTER — Ambulatory Visit (INDEPENDENT_AMBULATORY_CARE_PROVIDER_SITE_OTHER): Payer: Medicaid Other | Admitting: Obstetrics & Gynecology

## 2020-03-21 ENCOUNTER — Other Ambulatory Visit: Payer: Self-pay

## 2020-03-21 ENCOUNTER — Encounter: Payer: Self-pay | Admitting: Obstetrics & Gynecology

## 2020-03-21 VITALS — BP 100/66 | HR 80 | Wt 226.0 lb

## 2020-03-21 DIAGNOSIS — Z9889 Other specified postprocedural states: Secondary | ICD-10-CM

## 2020-03-21 NOTE — Progress Notes (Signed)
Subjective:     Julia Rollins is a 29 y.o. female who presents to the clinic 4 weeks status post LBTL for requested sterilization. Eating a regular diet without difficulty. Bowel movements are normal. The patient is not having any pain.  The following portions of the patient's history were reviewed and updated as appropriate: allergies, current medications, past family history, past medical history, past social history, past surgical history and problem list.  Review of Systems small drainage at umbilical incision    Objective:    BP 100/66   Pulse 80   Wt 226 lb (102.5 kg)   LMP 03/03/2020   BMI 36.48 kg/m  General:  alert, cooperative and no distress  Abdomen: soft, bowel sounds active, non-tender  Incision:   healing well, no drainage, no erythema, no hernia, no seroma, no swelling, no dehiscence, incision well approximated, possible recent serous drainage     Assessment:    Doing well postoperatively. Operative findings again reviewed. Pathology report discussed.    Plan:    1. Continue any current medications. 2. Wound care discussed. 3. Activity restrictions: none 4. Anticipated return to work: now. 5. Follow up: for annual exams  Adam Phenix, MD 03/21/2020 .

## 2020-03-21 NOTE — Patient Instructions (Signed)
Laparoscopic Tubal Ligation, Care After This sheet gives you information about how to care for yourself after your procedure. Your health care provider may also give you more specific instructions. If you have problems or questions, contact your health care provider. What can I expect after the procedure? After the procedure, it is common to have:  A sore throat.  Discomfort in your shoulder.  Mild discomfort or cramping in your abdomen.  Gas pains.  Pain or soreness in the area where the surgical incision was made.  A bloated feeling.  Tiredness.  Nausea.  Vomiting. Follow these instructions at home: Medicines  Take over-the-counter and prescription medicines only as told by your health care provider.  Do not take aspirin because it can cause bleeding.  Ask your health care provider if the medicine prescribed to you: ? Requires you to avoid driving or using heavy machinery. ? Can cause constipation. You may need to take actions to prevent or treat constipation, such as:  Drink enough fluid to keep your urine pale yellow.  Take over-the-counter or prescription medicines.  Eat foods that are high in fiber, such as beans, whole grains, and fresh fruits and vegetables.  Limit foods that are high in fat and processed sugars, such as fried or sweet foods. Incision care      Follow instructions from your health care provider about how to take care of your incision. Make sure you: ? Wash your hands with soap and water before and after you change your bandage (dressing). If soap and water are not available, use hand sanitizer. ? Change your dressing as told by your health care provider. ? Leave stitches (sutures), skin glue, or adhesive strips in place. These skin closures may need to stay in place for 2 weeks or longer. If adhesive strip edges start to loosen and curl up, you may trim the loose edges. Do not remove adhesive strips completely unless your health care provider  tells you to do that.  Check your incision area every day for signs of infection. Check for: ? Redness, swelling, or pain. ? Fluid or blood. ? Warmth. ? Pus or a bad smell. Activity  Rest as told by your health care provider.  Avoid sitting for a long time without moving. Get up to take short walks every 1-2 hours. This is important to improve blood flow and breathing. Ask for help if you feel weak or unsteady.  Return to your normal activities as told by your health care provider. Ask your health care provider what activities are safe for you. General instructions  Do not take baths, swim, or use a hot tub until your health care provider approves. Ask your health care provider if you may take showers. You may only be allowed to take sponge baths.  Have someone help you with your daily household tasks for the first few days.  Keep all follow-up visits as told by your health care provider. This is important. Contact a health care provider if:  You have redness, swelling, or pain around your incision.  Your incision feels warm to the touch.  You have pus or a bad smell coming from your incision.  The edges of your incision break open after the sutures have been removed.  Your pain does not improve after 2-3 days.  You have a rash.  You repeatedly become dizzy or light-headed.  Your pain medicine is not helping. Get help right away if you:  Have a fever.  Faint.  Have increasing   pain in your abdomen.  Have severe pain in one or both of your shoulders.  Have fluid or blood coming from your sutures or from your vagina.  Have shortness of breath or difficulty breathing.  Have chest pain or leg pain.  Have ongoing nausea, vomiting, or diarrhea. Summary  After the procedure, it is common to have mild discomfort or cramping in your abdomen.  Take over-the-counter and prescription medicines only as told by your health care provider.  Watch for symptoms that should  prompt you to call your health care provider.  Keep all follow-up visits as told by your health care provider. This is important. This information is not intended to replace advice given to you by your health care provider. Make sure you discuss any questions you have with your health care provider. Document Revised: 05/10/2019 Document Reviewed: 10/26/2018 Elsevier Patient Education  2020 [employer]  

## 2020-07-02 IMAGING — US US MFM OB COMP + 14 WK
1 series · 14 of 28 positions shown · non-contrast
Comparison: none

[Series 1: us mfm ob comp + 14 wk · 81 acquisitions, 14 frames shown]
[im 3/81]
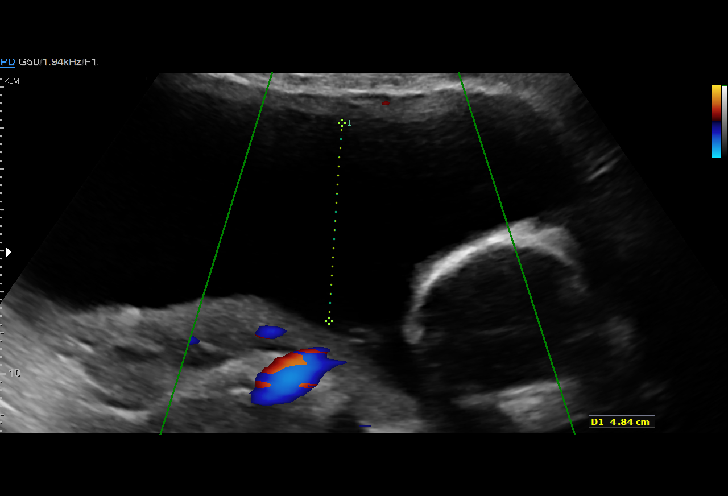
[im 9/81]
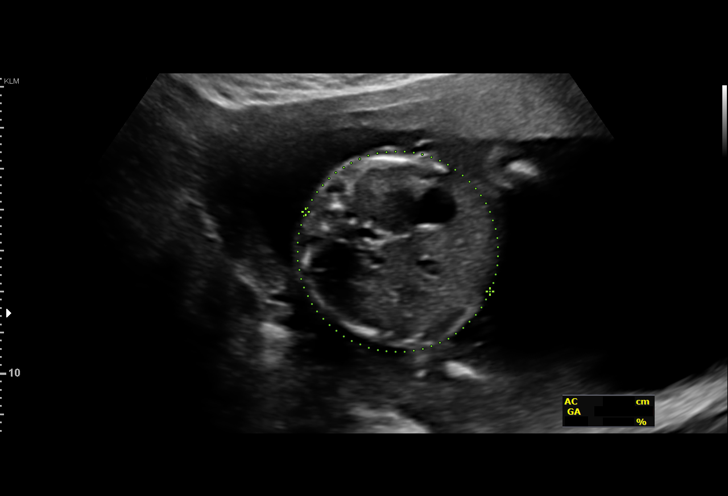
[im 15/81]
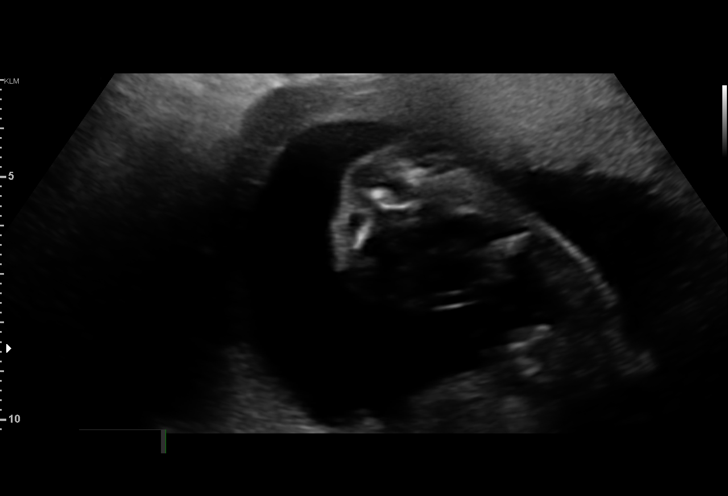
[im 21/81]
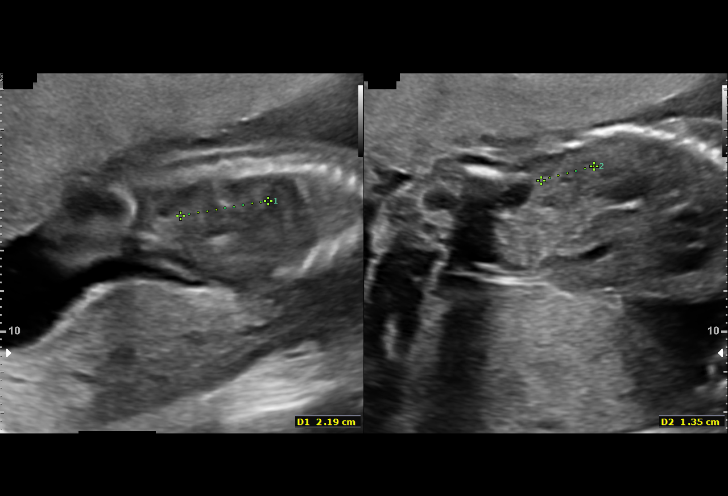
[im 27/81]
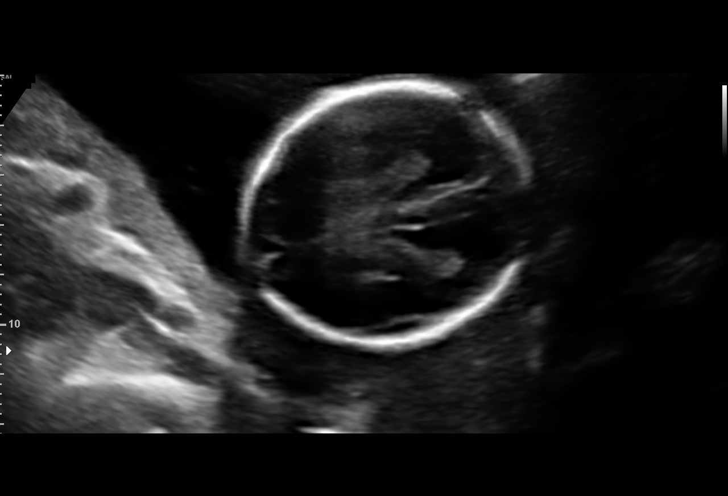
[im 33/81]
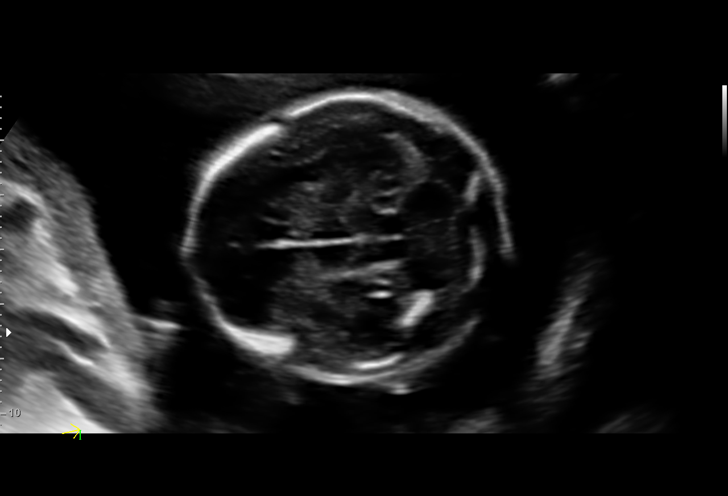
[im 39/81]
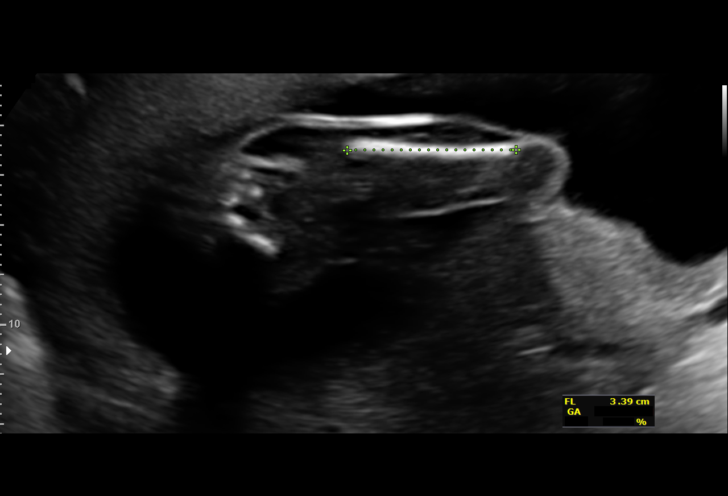
[im 45/81]
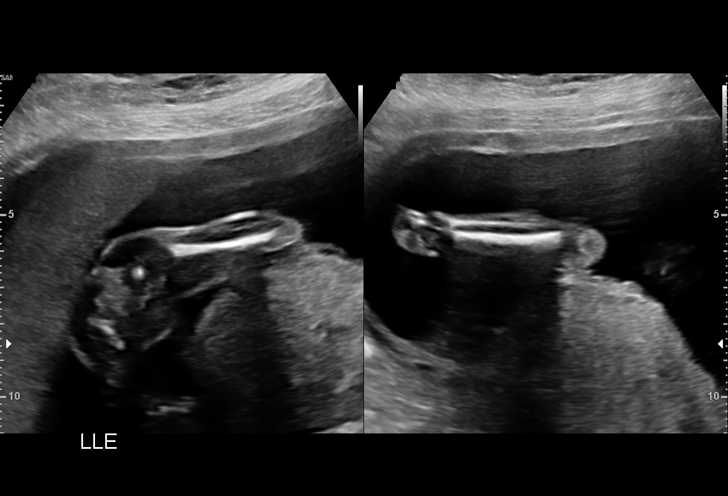
[im 51/81]
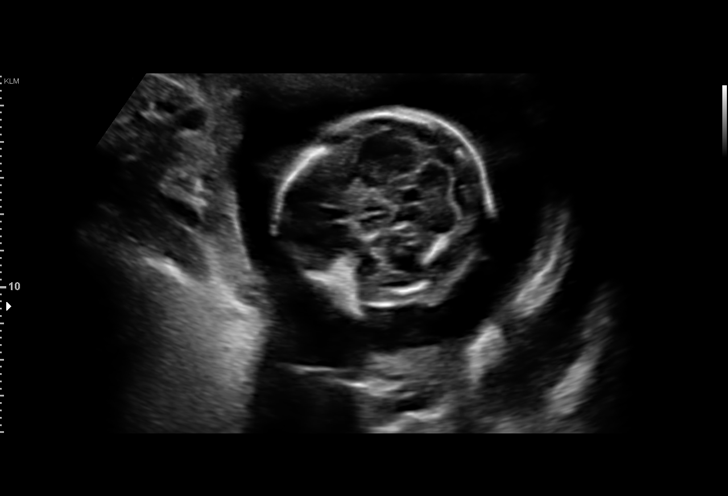
[im 57/81]
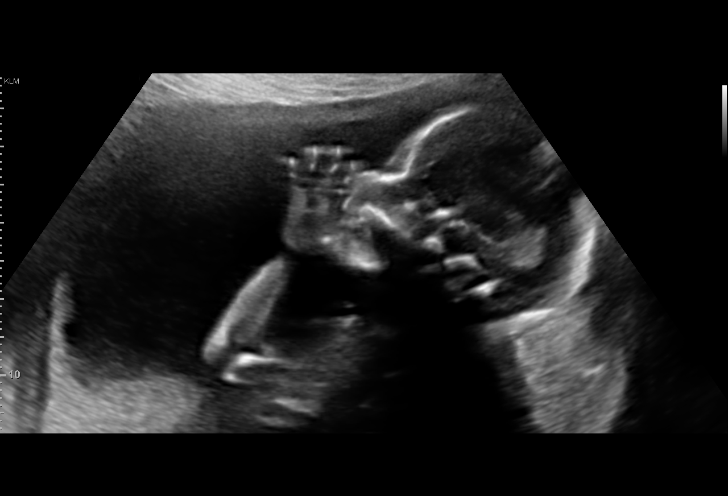
[im 63/81]
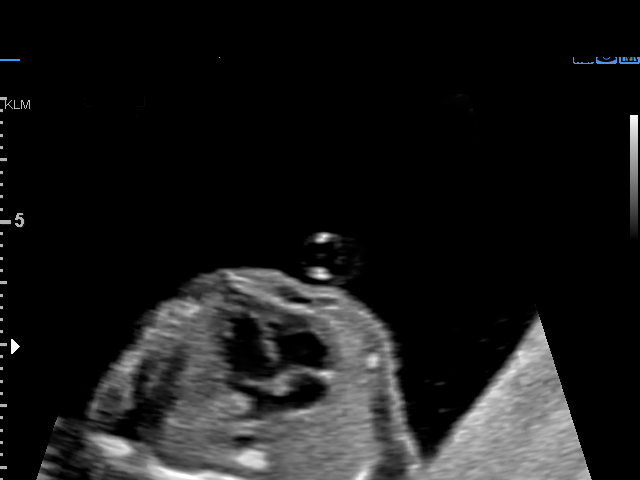
[im 69/81]
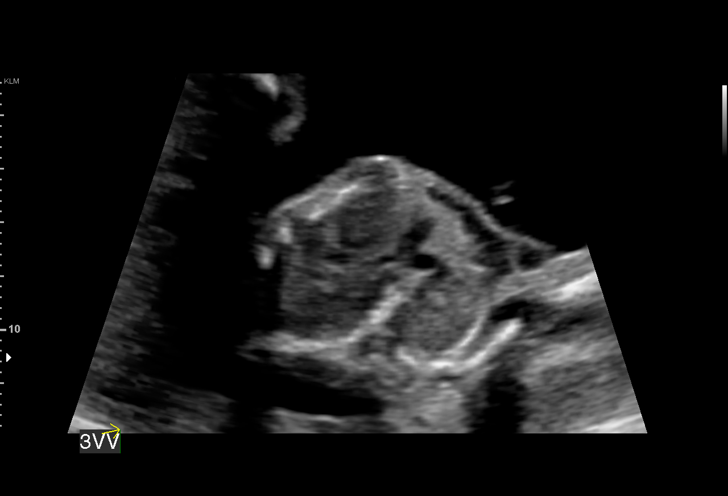
[im 75/81]
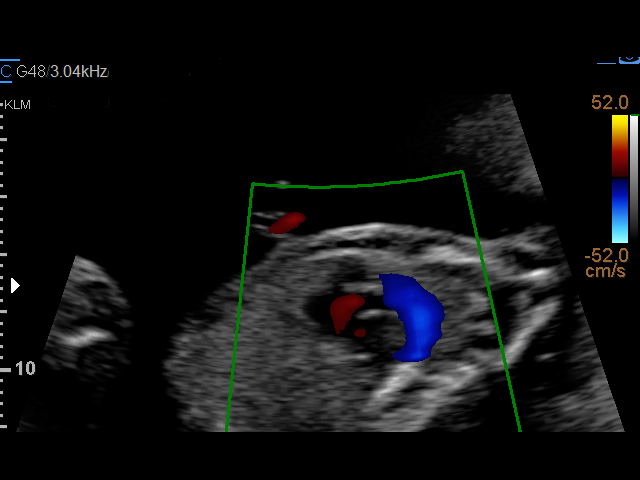
[im 81/81]
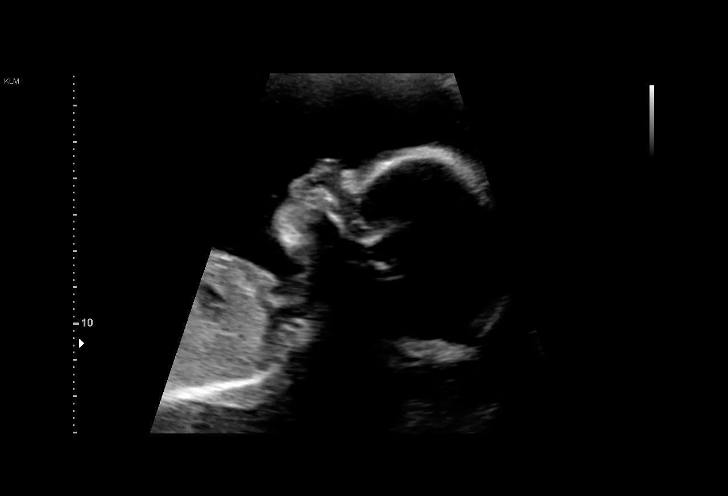

[14 of 28 positions shown; findings below may reference images not displayed]

1  US MFM OB COMP + 14 WK               76805.01     [facility]
 ----------------------------------------------------------------------

 ----------------------------------------------------------------------
Indications

  Encounter for antenatal screening for
  malformations (low risk Panorama)
  20 weeks gestation of pregnancy
 ----------------------------------------------------------------------
Fetal Evaluation

 Num Of Fetuses:         1
 Fetal Heart Rate(bpm):  148
 Cardiac Activity:       Observed
 Presentation:           Cephalic
 Placenta:               Anterior
 P. Cord Insertion:      Visualized

 Amniotic Fluid
 AFI FV:      Within normal limits
Biometry

 BPD:      51.1  mm     G. Age:  21w 3d         90  %    CI:        80.01   %    70 - 86
                                                         FL/HC:      19.2   %    16.8 -
 HC:      180.5  mm     G. Age:  20w 3d         50  %    HC/AC:      1.14        1.09 -
 AC:      158.4  mm     G. Age:  21w 0d         67  %    FL/BPD:     67.7   %
 FL:       34.6  mm     G. Age:  20w 6d         64  %    FL/AC:      21.8   %    20 - 24
 HUM:        34  mm     G. Age:  21w 4d         85  %
 CER:      20.9  mm     G. Age:  19w 6d         41  %

 Est. FW:     386  gm    0 lb 14 oz      80  %
OB History

 Gravidity:    2         Term:   1        Prem:   0        SAB:   0
 TOP:          0       Ectopic:  0        Living: 1
Gestational Age

 LMP:           20w 2d        Date:  03/06/19                 EDD:   12/11/19
 U/S Today:     21w 0d                                        EDD:   12/06/19
 Best:          20w 2d     Det. By:  LMP  (03/06/19)          EDD:   12/11/19
Anatomy

 Cranium:               Appears normal         Aortic Arch:            Not well visualized
 Cavum:                 Appears normal         Ductal Arch:            Appears normal
 Ventricles:            Appears normal         Diaphragm:              Appears normal
 Choroid Plexus:        Appears normal         Stomach:                Appears normal, left
                                                                       sided
 Cerebellum:            Appears normal         Abdomen:                Appears normal
 Posterior Fossa:       Appears normal         Abdominal Wall:         Appears nml (cord
                                                                       insert, abd wall)
 Nuchal Fold:           Appears normal         Cord Vessels:           Appears normal (3
                                                                       vessel cord)
 Face:                  Appears normal         Kidneys:                Appear normal
                        (orbits and profile)
 Lips:                  Appears normal         Bladder:                Appears normal
 Thoracic:              Appears normal         Spine:                  Appears normal
 Heart:                 Appears normal         Upper Extremities:      Appears normal
                        (4CH, axis, and
                        situs)
 RVOT:                  Appears normal         Lower Extremities:      Appears normal
 LVOT:                  Appears normal

 Other:  Heels visualized. Open hands visualized. Technically difficult due to
         fetal position. Fetus appears to be a male.
Cervix Uterus Adnexa

 Cervix
 Length:            4.3  cm.
 Normal appearance by transabdominal scan.
Impression

 Normal interval growth.  No ultrasonic evidence of structural
 fetal anomalies.
Recommendations

 Follow up growth as clinically indicated.

## 2020-11-19 IMAGING — US US MFM OB FOLLOW-UP
1 series · 13 of 28 positions shown · non-contrast
Comparison: none

[Series 1: us mfm ob follow-up · 60 acquisitions, 13 frames shown]
[im 3/60]
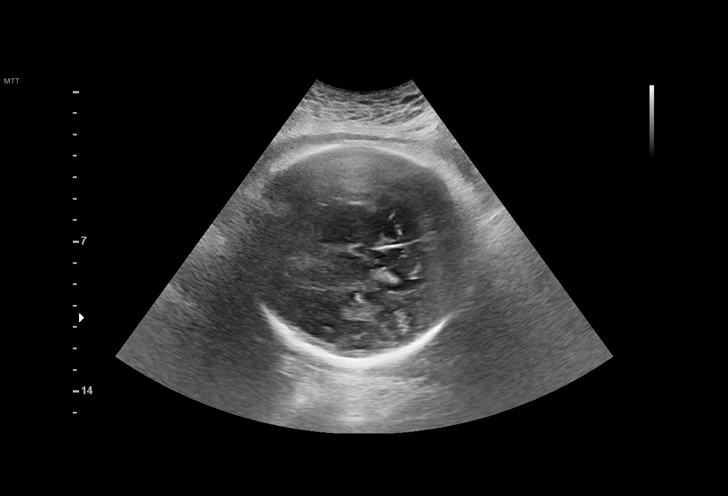
[im 7/60]
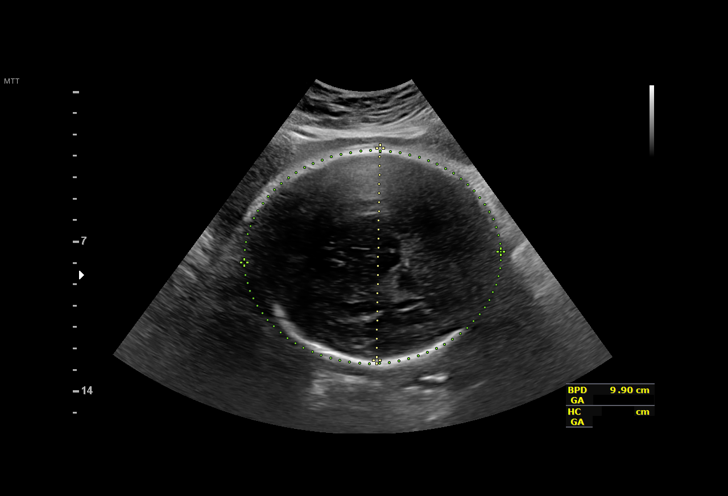
[im 11/60]
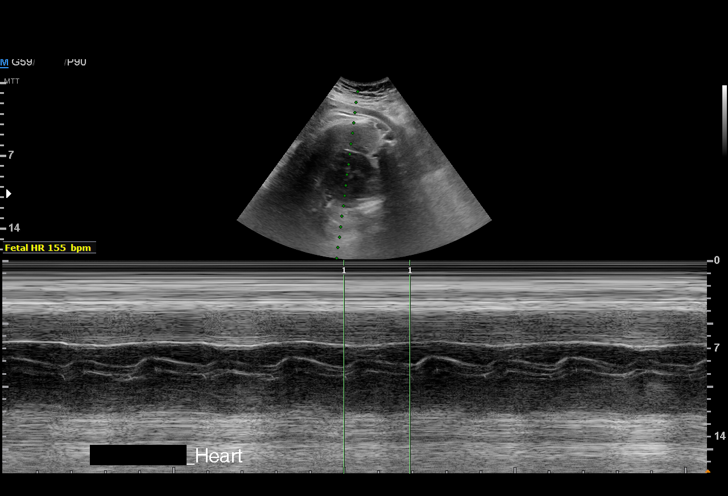
[im 16/60]
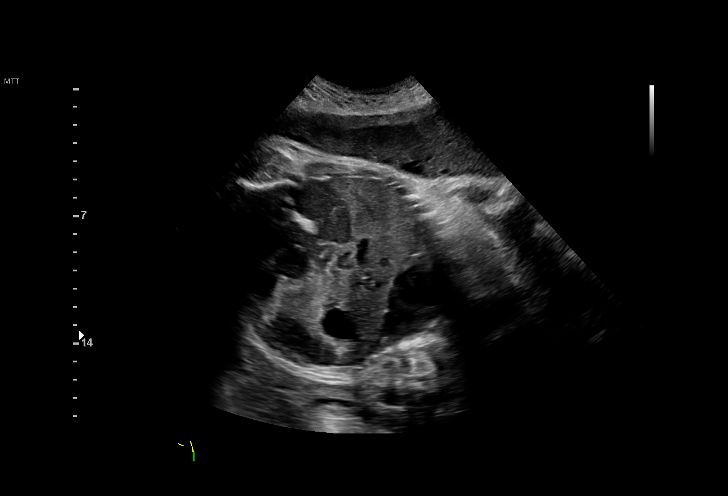
[im 20/60]
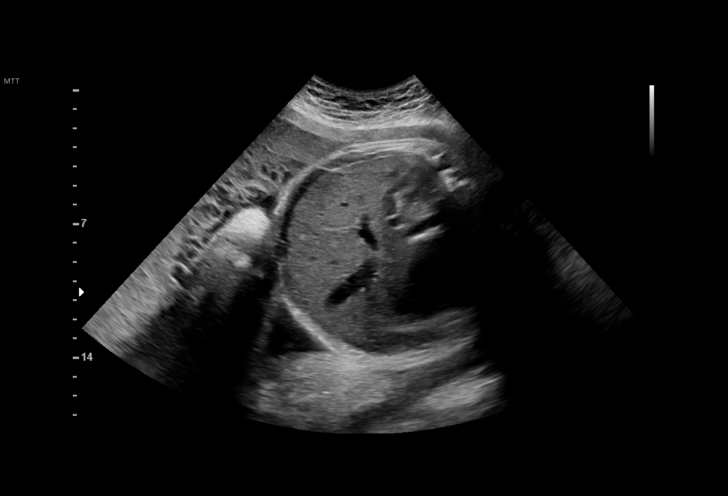
[im 25/60]
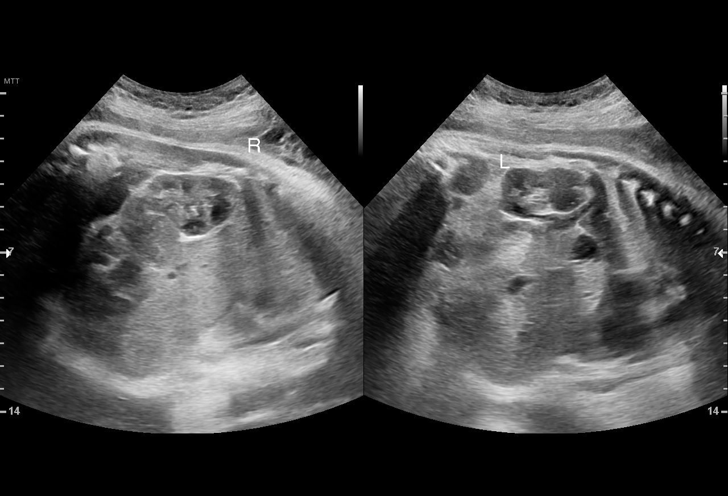
[im 31/60]
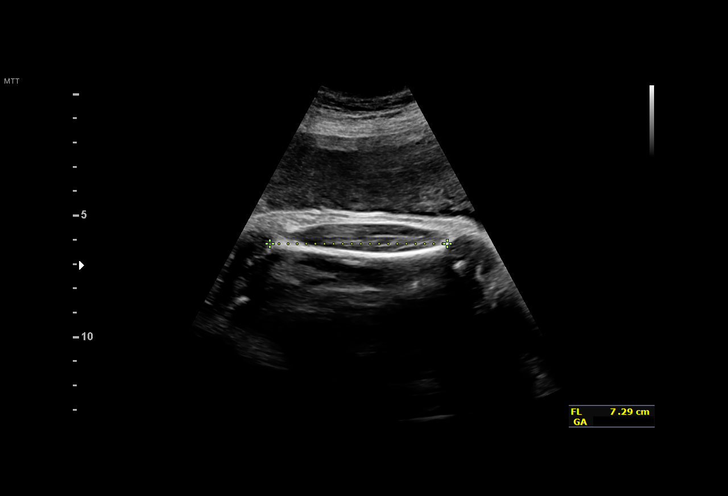
[im 35/60]
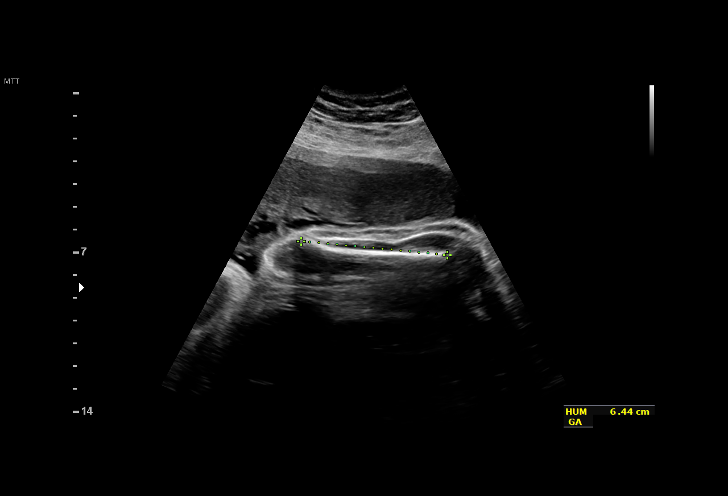
[im 40/60]
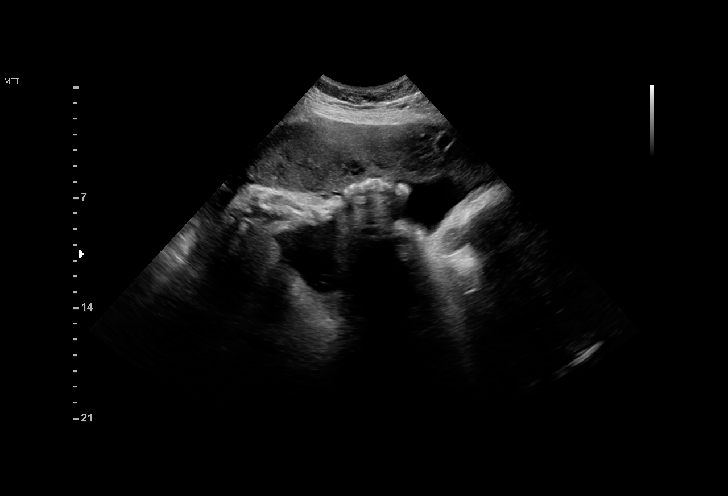
[im 44/60]
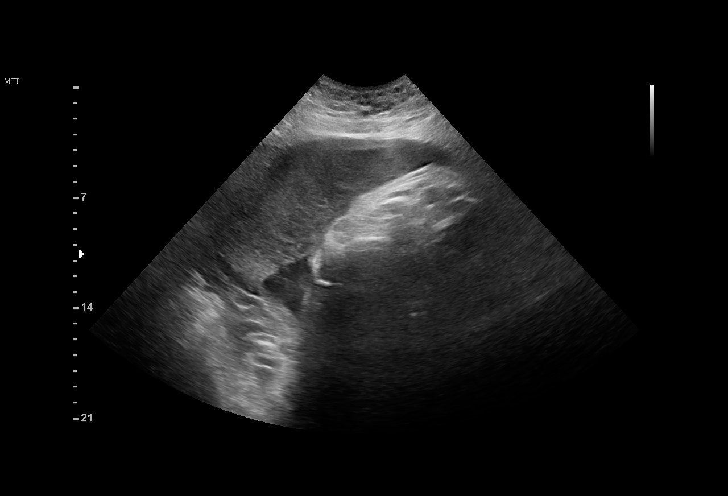
[im 49/60]
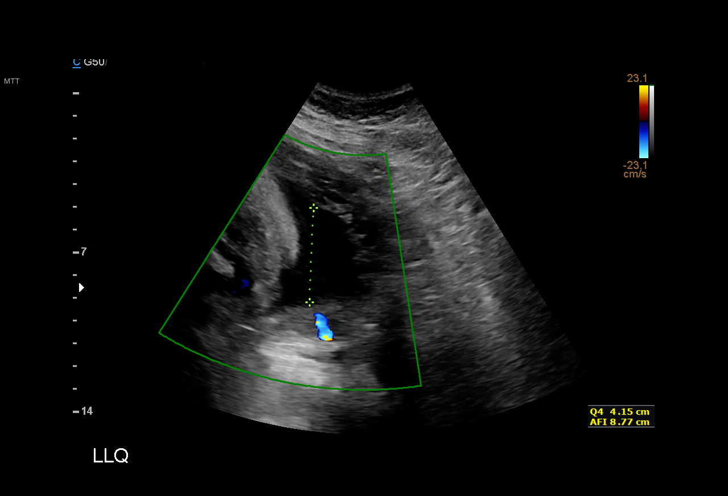
[im 53/60]
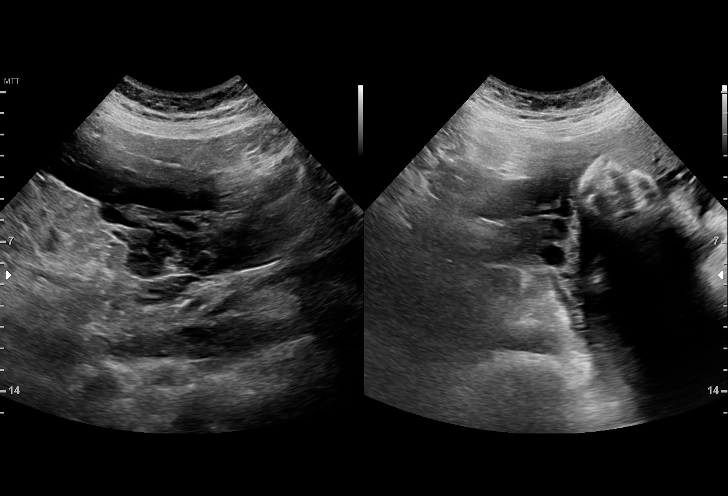
[im 57/60]
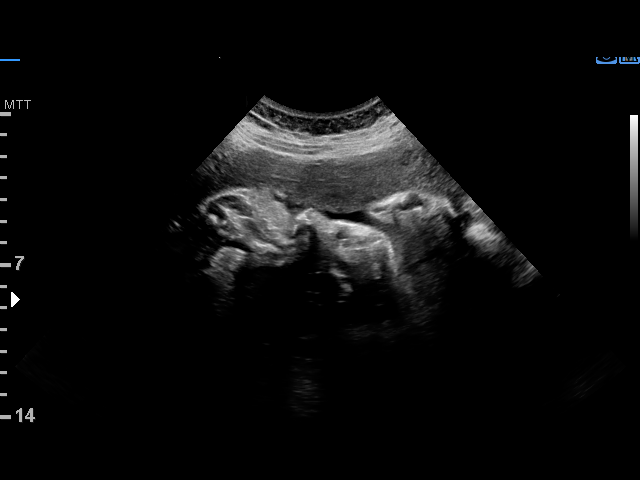

[13 of 28 positions shown; findings below may reference images not displayed]

----------------------------------------------------------------------

 ----------------------------------------------------------------------
Indications

  40 weeks gestation of pregnancy
  Postdate pregnancy (40-42 weeks)
  Uterine size-date discrepancy, third trimester
  Previous cesarean delivery, antepartum
  Encounter for other antenatal screening
  follow-up
 ----------------------------------------------------------------------
Fetal Evaluation

 Num Of Fetuses:         1
 Fetal Heart Rate(bpm):  155
 Cardiac Activity:       Observed
 Presentation:           Cephalic
 Placenta:               Anterior
 P. Cord Insertion:      Previously Visualized

 Amniotic Fluid
 AFI FV:      Within normal limits

 AFI Sum(cm)     %Tile       Largest Pocket(cm)
 8.77            20

 RUQ(cm)       RLQ(cm)       LUQ(cm)        LLQ(cm)
 2.16          4.15          2.47           0
Biophysical Evaluation

 Amniotic F.V:   Pocket => 2 cm             F. Tone:        Observed
 F. Movement:    Observed                   Score:          [DATE]
 F. Breathing:   Observed
Biometry

 BPD:      98.9  mm     G. Age:  40w 4d         93  %    CI:        83.11   %    70 - 86
                                                         FL/HC:      21.6   %    20.7 -
 HC:      342.1  mm     G. Age:  39w 3d         31  %    HC/AC:      0.86        0.87 -
 AC:      397.3  mm     G. Age:  43w 5d       > 99  %    FL/BPD:     74.8   %    71 - 87
 FL:         74  mm     G. Age:  37w 6d         11  %    FL/AC:      18.6   %    20 - 24
 HUM:      65.2  mm     G. Age:  37w 6d

 LV:        7.2  mm

 Est. FW:    8678  gm         10 lb      97  %
OB History

 Gravidity:    2         Term:   1        Prem:   0        SAB:   0
 TOP:          0       Ectopic:  0        Living: 1
Gestational Age

 LMP:           40w 2d        Date:  03/06/19                 EDD:   12/11/19
 U/S Today:     40w 3d                                        EDD:   12/10/19
 Best:          40w 2d     Det. By:  LMP  (03/06/19)          EDD:   12/11/19
Anatomy

 Cranium:               Appears normal         Aortic Arch:            Appears normal
 Cavum:                 Previously seen        Ductal Arch:            Previously seen
 Ventricles:            Appears normal         Diaphragm:              Appears normal
 Choroid Plexus:        Previously seen        Stomach:                Appears normal, left
                                                                       sided
 Cerebellum:            Previously seen        Abdomen:                Previously seen
 Posterior Fossa:       Previously seen        Abdominal Wall:         Previously seen
 Nuchal Fold:           Previously seen        Cord Vessels:           Previously seen
 Face:                  Orbits and profile     Kidneys:                Appear normal
                        previously seen
 Lips:                  Previously seen        Bladder:                Appears normal
 Thoracic:              Previously seen        Spine:                  Previously seen
 Heart:                 Appears normal         Upper Extremities:      Previously seen
                        (4CH, axis, and
                        situs)
 RVOT:                  Previously seen        Lower Extremities:      Previously seen
 LVOT:                  Previously seen

 Other:  Heels previously seen. Open hands previously visualized. Male
         gender previously seen. Technically difficult due to advanced GA and
         fetal position.
Cervix Uterus Adnexa

 Cervix
 Not visualized (advanced GA >36wks)

 Uterus
 No abnormality visualized.

 Left Ovary
 Within normal limits. No adnexal mass visualized.
 Right Ovary
 Within normal limits. No adnexal mass visualized.

 Cul De Sac
 No free fluid seen.

 Adnexa
 No abnormality visualized.
Impression

 Patient returns for fetal growth assessment because of
 suspicion of large baby on clinical examination.  She had a
 previous cesarean delivery and is planning VBAC.
 On ultrasound, the estimated fetal weight is 452 4 g or 10
 pounds (97 percentile).  Abdominal circumference
 measurement is at greater than 99 percentile.  Amniotic fluid
 is normal good fetal activity seen.  Cephalic presentation.
 Antenatal testing is reassuring.  BPP [DATE].  Placenta is anterior
 and there is no evidence of previa or accreta.
 I explained the findings and that ultrasound has limitations
 and accurately estimating fetal weights and there is a margin
 of error up to 15%.
 Patient is keen on VBAC and would like to wait till 41 weeks
 gestation.  She will be discussing with the provider.
 I sent a message ([REDACTED]) to Arelis Ruvalcaba, NP, who saw her
 on 12/07/19.

                 Maguje, Kedynice

## 2020-11-20 ENCOUNTER — Encounter: Payer: Self-pay | Admitting: General Practice

## 2022-10-23 ENCOUNTER — Encounter (HOSPITAL_COMMUNITY): Payer: Self-pay

## 2022-10-23 ENCOUNTER — Ambulatory Visit (HOSPITAL_COMMUNITY)
Admission: RE | Admit: 2022-10-23 | Discharge: 2022-10-23 | Disposition: A | Discharge status: Home / Self Care | Payer: Medicaid Other | Source: Ambulatory Visit | Attending: Emergency Medicine | Admitting: Emergency Medicine

## 2022-10-23 VITALS — BP 101/66 | HR 68 | Temp 98.5°F | Resp 16

## 2022-10-23 DIAGNOSIS — Z202 Contact with and (suspected) exposure to infections with a predominantly sexual mode of transmission: Secondary | ICD-10-CM | POA: Insufficient documentation

## 2022-10-23 DIAGNOSIS — Z113 Encounter for screening for infections with a predominantly sexual mode of transmission: Secondary | ICD-10-CM | POA: Diagnosis present

## 2022-10-23 MED ORDER — DOXYCYCLINE HYCLATE 100 MG PO CAPS: 100.0000 mg | ORAL_CAPSULE | Freq: Two times a day (BID) | ORAL | 0 refills | Status: AC

## 2022-10-23 NOTE — Discharge Instructions (Signed)
With your exposure we will treat you for possible chlamydia. Please take medication as prescribed.  We will call you if anything on your swab returns positive. Please abstain from sexual intercourse until your results return. If swab returns negative, discontinue the doxycycline.

## 2022-10-23 NOTE — ED Notes (Signed)
Discharged by Dee, CMA.  

## 2022-10-23 NOTE — ED Triage Notes (Signed)
Patient states her partner from September has gotten a positive chlamydia.

## 2022-10-23 NOTE — ED Provider Notes (Signed)
[facility]    CSN: [id number] Arrival date & time: 10/23/22  1116      History   Chief Complaint Chief Complaint  Patient presents with   Exposure to STD    Entered by patient    HPI Julia Rollins is a 31 y.o. female.  Presents with exposure to chlamydia Partner from September had a positive chlamydia test yesterday Patient denies any symptoms currently  Julia Rollins had negative swab in August  LMP today  Past Medical History:  Diagnosis Date   Gestational hyperglycemia 12/16/2019   History of cesarean section 10/17/2017   LGA (large for gestational age) fetus affecting management of mother 12/13/2019   [place] estimates baby to be 4500 gm or 10 pounds on 12-13-19   Medical history non-contributory    Obesity in pregnancy, antepartum, third trimester 12/07/2019   Previous cesarean delivery affecting pregnancy 06/24/2019   Desires TOLAC   Supervision of other normal pregnancy, antepartum 06/14/2019    Nursing Staff Provider Office Location [facility] Dating  LMP Language  English Anatomy US  Normal Flu Vaccine  N/A Genetic Screen  NIPS: Normal  AFP: Negative  TDaP vaccine    Hgb A1C or  GTT Early  Third trimester nmL 73-132-94 Rhogam  N/A   LAB RESULTS  Feeding Plan Breast Blood Type O/Positive/-- (07/10 0921)  Contraception Paragard Antibody Negative (07/10 4081) Circumcision If boy, No Rube    Patient Active Problem List   Diagnosis Date Noted   History of marijuana use 04/08/2017    Past Surgical History:  Procedure Laterality Date   CESAREAN SECTION N/A 10/09/2017   Procedure: CESAREAN SECTION;  Surgeon: Linzie Collin, MD;  Location: [facility];  Service: Obstetrics;  Laterality: N/A;   HERNIA REPAIR     34 or 31 years old   LAPAROSCOPIC TUBAL LIGATION Bilateral 02/15/2020   Procedure: LAPAROSCOPIC TUBAL LIGATION;  Surgeon: Adam Phenix, MD;  Location: [facility];  Service: Gynecology;  Laterality: Bilateral;    OB History     Gravida  2    Para  2   Term  2   Preterm      AB      Living  2      SAB      IAB      Ectopic      Multiple  0   Live Births  2            Home Medications    Prior to Admission medications   Medication Sig Start Date End Date Taking? Authorizing Provider  doxycycline (VIBRAMYCIN) 100 MG capsule Take 1 capsule (100 mg total) by mouth 2 (two) times daily for 7 days. 10/23/22 10/30/22 Yes Laquilla Dault, Ray Church    Family History History reviewed. No pertinent family history.  Social History Social History   Tobacco Use   Smoking status: Never   Smokeless tobacco: Never  Vaping Use   Vaping Use: Never used  Substance Use Topics   Alcohol use: Yes    Comment: occ   Drug use: Yes    Types: Marijuana    Comment: last marijuana yesterday     Allergies   Patient has no known allergies.   Review of Systems Review of Systems Per HPI  Physical Exam Triage Vital Signs ED Triage Vitals  Enc Vitals Group     BP 10/23/22 1154 101/66     Pulse Rate 10/23/22 1154 68     Resp 10/23/22 1154  16     Temp 10/23/22 1154 98.5 F (36.9 C)     Temp Source 10/23/22 1154 Oral     SpO2 10/23/22 1154 100 %     Weight --      Height --      Head Circumference --      Peak Flow --      Pain Score 10/23/22 1153 0     Pain Loc --      Pain Edu? --      Excl. in GC? --    No data found.  Updated Vital Signs BP 101/66 (BP Location: Left Arm)   Pulse 68   Temp 98.5 F (36.9 C) (Oral)   Resp 16   LMP 09/30/2022 (Exact Date)   SpO2 100%    Physical Exam Vitals and nursing note reviewed.  Constitutional:      General: Julia Rollins is not in acute distress.    Appearance: Normal appearance.  HENT:     Mouth/Throat:     Pharynx: Oropharynx is clear.  Cardiovascular:     Rate and Rhythm: Normal rate and regular rhythm.     Pulses: Normal pulses.  Pulmonary:     Effort: Pulmonary effort is normal.  Neurological:     Mental Status: Julia Rollins is alert and oriented to person, place,  and time.     UC Treatments / Results  Labs (all labs ordered are listed, but only abnormal results are displayed) Labs Reviewed  CERVICOVAGINAL ANCILLARY ONLY    EKG   Radiology No results found.  Procedures Procedures   Medications Ordered in UC Medications - No data to display  Initial Impression / Assessment and Plan / UC Course  I have reviewed the triage vital signs and the nursing notes.  Pertinent labs & imaging results that were available during my care of the patient were reviewed by me and considered in my medical decision making (see chart for details).  Vital swab pending.  Discussed results will return tomorrow With exposure to chlamydia, patient would like treatment Doxycycline twice daily for 7 days Discussed if swab returns negative, discontinue doxycycline Discussed safe sex practices, return precautions, patient agrees to plan  Final Clinical Impressions(s) / UC Diagnoses   Final diagnoses:  Possible exposure to STD  Exposure to chlamydia  Screen for STD (sexually transmitted disease)     Discharge Instructions      With your exposure we will treat you for possible chlamydia. Please take medication as prescribed.  We will call you if anything on your swab returns positive. Please abstain from sexual intercourse until your results return. If swab returns negative, discontinue the doxycycline.     ED Prescriptions     Medication Sig Dispense Auth. Provider   doxycycline (VIBRAMYCIN) 100 MG capsule Take 1 capsule (100 mg total) by mouth 2 (two) times daily for 7 days. 14 capsule Skylyn Slezak, Lurena Joiner, PA-C      PDMP not reviewed this encounter.   Cybil Sen[place], Lurena Joiner, [place] 10/23/22 1303

## 2022-10-24 ENCOUNTER — Telehealth (HOSPITAL_COMMUNITY): Payer: Self-pay

## 2022-10-24 LAB — CERVICOVAGINAL ANCILLARY ONLY
Bacterial Vaginitis (gardnerella): POSITIVE — AB
Candida Glabrata: NEGATIVE
Candida Vaginitis: POSITIVE — AB
Chlamydia: NEGATIVE
Comment: NEGATIVE
Comment: NEGATIVE
Comment: NEGATIVE
Comment: NEGATIVE
Comment: NEGATIVE
Comment: NORMAL
Neisseria Gonorrhea: NEGATIVE
Trichomonas: NEGATIVE

## 2022-10-24 MED ORDER — FLUCONAZOLE 150 MG PO TABS: 150.0000 mg | ORAL_TABLET | Freq: Every day | ORAL | 0 refills | Status: DC

## 2022-10-24 MED ORDER — METRONIDAZOLE 500 MG PO TABS: 500.0000 mg | ORAL_TABLET | Freq: Two times a day (BID) | ORAL | 0 refills | Status: DC

## 2023-01-27 ENCOUNTER — Ambulatory Visit
Admission: RE | Admit: 2023-01-27 | Discharge: 2023-01-27 | Disposition: A | Discharge status: Home / Self Care | Payer: Medicaid Other | Source: Ambulatory Visit | Attending: Urgent Care | Admitting: Urgent Care

## 2023-01-27 VITALS — BP 106/67 | HR 68 | Temp 99.7°F | Resp 18

## 2023-01-27 DIAGNOSIS — N760 Acute vaginitis: Secondary | ICD-10-CM | POA: Diagnosis not present

## 2023-01-27 DIAGNOSIS — N898 Other specified noninflammatory disorders of vagina: Secondary | ICD-10-CM | POA: Insufficient documentation

## 2023-01-27 MED ORDER — FLUCONAZOLE 150 MG PO TABS: 150.0000 mg | ORAL_TABLET | ORAL | 0 refills | Status: AC

## 2023-01-27 NOTE — ED Triage Notes (Signed)
Pt requesting STD screening-reports +minimal vaginal d/c-NAD-steady gait

## 2023-01-27 NOTE — ED Provider Notes (Signed)
[facility]  Note:  This document was prepared using Systems analyst and may include unintentional dictation errors.  MRN: [id number] DOB: [birth year]  Subjective:   Julia Rollins is a 32 y.o. female presenting for mild vaginal discharge this week.  Has a history of yeast and BV infections.  Would like to be covered for yeast today.  She has 1 sex partner, does not use condoms for protection.  Wants to make sure about STIs. Denies fever, n/v, abdominal pain, pelvic pain, rashes, dysuria, urinary frequency, hematuria.   No current facility-administered medications for this encounter.  Current Outpatient Medications:    fluconazole (DIFLUCAN) 150 MG tablet, Take 1 tablet (150 mg total) by mouth daily., Disp: 2 tablet, Rfl: 0   metroNIDAZOLE (FLAGYL) 500 MG tablet, Take 1 tablet (500 mg total) by mouth 2 (two) times daily., Disp: 14 tablet, Rfl: 0   No Known Allergies  Past Medical History:  Diagnosis Date   Gestational hyperglycemia 12/16/2019   History of cesarean section 10/17/2017   LGA (large for gestational age) fetus affecting management of mother 12/13/2019   [place] estimates baby to be 4500 gm or 10 pounds on 12-13-19   Medical history non-contributory    Obesity in pregnancy, antepartum, third trimester 12/07/2019   Previous cesarean delivery affecting pregnancy 06/24/2019   Desires TOLAC   Supervision of other normal pregnancy, antepartum 06/14/2019    Nursing Staff Provider Office Location [facility] Dating  LMP Language  English Anatomy US  Normal Flu Vaccine  N/A Genetic Screen  NIPS: Normal  AFP: Negative  TDaP vaccine    Hgb A1C or  GTT Early  Third trimester nmL 73-132-94 Rhogam  N/A   LAB RESULTS  Feeding Plan Breast Blood Type O/Positive/-- (07/10 [id number])  Contraception Paragard Antibody Negative (07/10 [id number]) Circumcision If boy, No Rube     Past Surgical History:  Procedure Laterality Date   CESAREAN SECTION N/A 10/09/2017    Procedure: CESAREAN SECTION;  Surgeon: Harlin Heys, MD;  Location: [facility];  Service: Obstetrics;  Laterality: N/A;   HERNIA REPAIR     39 or 32 years old   LAPAROSCOPIC TUBAL LIGATION Bilateral 02/15/2020   Procedure: LAPAROSCOPIC TUBAL LIGATION;  Surgeon: Woodroe Mode, MD;  Location: [place];  Service: Gynecology;  Laterality: Bilateral;    No family history on file.  Social History   Tobacco Use   Smoking status: Never   Smokeless tobacco: Never  Vaping Use   Vaping Use: Never used  Substance Use Topics   Alcohol use: Yes    Comment: occ   Drug use: Yes    Types: Marijuana    Comment: last marijuana yesterday    ROS   Objective:   Vitals: BP 106/67   Pulse 68   Temp 99.7 F (37.6 C) (Oral)   Resp 18   LMP 01/07/2023   SpO2 99%   Physical Exam Constitutional:      General: She is not in acute distress.    Appearance: Normal appearance. She is well-developed. She is not ill-appearing, toxic-appearing or diaphoretic.  HENT:     Head: Normocephalic and atraumatic.     Nose: Nose normal.     Mouth/Throat:     Mouth: Mucous membranes are moist.  Eyes:     General: No scleral icterus.       Right eye: No discharge.        Left eye: No discharge.  Extraocular Movements: Extraocular movements intact.     Conjunctiva/sclera: Conjunctivae normal.  Cardiovascular:     Rate and Rhythm: Normal rate.  Pulmonary:     Effort: Pulmonary effort is normal.  Abdominal:     General: Bowel sounds are normal. There is no distension.     Palpations: Abdomen is soft. There is no mass.     Tenderness: There is no abdominal tenderness. There is no right CVA tenderness, left CVA tenderness, guarding or rebound.  Skin:    General: Skin is warm and dry.  Neurological:     General: No focal deficit present.     Mental Status: She is alert and oriented to person, place, and time.  Psychiatric:        Mood and Affect: Mood normal.        Behavior:  Behavior normal.        Thought Content: Thought content normal.        Judgment: Judgment normal.     Assessment and Plan :   PDMP not reviewed this encounter.  1. Acute vaginitis   2. Vaginal discharge     Patient declined urine pregnancy test given tubal ligation.  Will cover for yeast vaginitis with fluconazole.  Labs pending, will treat as appropriate otherwise. Counseled patient on potential for adverse effects with medications prescribed/recommended today, ER and return-to-clinic precautions discussed, patient verbalized understanding.    Jaynee Eagles, PA-C 01/27/23 1719

## 2023-01-28 LAB — CERVICOVAGINAL ANCILLARY ONLY
Bacterial Vaginitis (gardnerella): POSITIVE — AB
Candida Glabrata: NEGATIVE
Candida Vaginitis: POSITIVE — AB
Chlamydia: NEGATIVE
Comment: NEGATIVE
Comment: NEGATIVE
Comment: NEGATIVE
Comment: NEGATIVE
Comment: NEGATIVE
Comment: NORMAL
Neisseria Gonorrhea: NEGATIVE
Trichomonas: NEGATIVE

## 2023-01-29 ENCOUNTER — Telehealth (HOSPITAL_COMMUNITY): Payer: Self-pay | Admitting: Emergency Medicine

## 2023-01-29 MED ORDER — METRONIDAZOLE 500 MG PO TABS: 500.0000 mg | ORAL_TABLET | Freq: Two times a day (BID) | ORAL | 0 refills | Status: AC
